# Patient Record
Sex: Male | Born: 1935 | Race: Black or African American | Hispanic: No | Marital: Married | Smoking: Former smoker
Health system: Southern US, Community
[De-identification: ages and names within clinical notes are randomized; demographics above are authoritative.]

## PROBLEM LIST (undated history)

## (undated) DIAGNOSIS — E785 Hyperlipidemia, unspecified: Secondary | ICD-10-CM

## (undated) DIAGNOSIS — Z86010 Personal history of colonic polyps: Secondary | ICD-10-CM

## (undated) DIAGNOSIS — E871 Hypo-osmolality and hyponatremia: Secondary | ICD-10-CM

## (undated) DIAGNOSIS — I739 Peripheral vascular disease, unspecified: Secondary | ICD-10-CM

## (undated) DIAGNOSIS — N400 Enlarged prostate without lower urinary tract symptoms: Secondary | ICD-10-CM

## (undated) DIAGNOSIS — K219 Gastro-esophageal reflux disease without esophagitis: Secondary | ICD-10-CM

## (undated) DIAGNOSIS — D509 Iron deficiency anemia, unspecified: Secondary | ICD-10-CM

## (undated) DIAGNOSIS — I10 Essential (primary) hypertension: Secondary | ICD-10-CM

## (undated) DIAGNOSIS — E119 Type 2 diabetes mellitus without complications: Secondary | ICD-10-CM

## (undated) DIAGNOSIS — G4700 Insomnia, unspecified: Secondary | ICD-10-CM

## (undated) DIAGNOSIS — N189 Chronic kidney disease, unspecified: Secondary | ICD-10-CM

## (undated) DIAGNOSIS — R071 Chest pain on breathing: Secondary | ICD-10-CM

## (undated) HISTORY — DX: Hyperlipidemia, unspecified: E78.5

## (undated) HISTORY — DX: Hypo-osmolality and hyponatremia: E87.1

## (undated) HISTORY — DX: Essential (primary) hypertension: I10

## (undated) HISTORY — DX: Chronic kidney disease, unspecified: N18.9

## (undated) HISTORY — DX: Iron deficiency anemia, unspecified: D50.9

## (undated) HISTORY — DX: Insomnia, unspecified: G47.00

## (undated) HISTORY — DX: Chest pain on breathing: R07.1

## (undated) HISTORY — DX: Type 2 diabetes mellitus without complications: E11.9

## (undated) HISTORY — DX: Personal history of colonic polyps: Z86.010

## (undated) HISTORY — DX: Gastro-esophageal reflux disease without esophagitis: K21.9

## (undated) HISTORY — DX: Benign prostatic hyperplasia without lower urinary tract symptoms: N40.0

## (undated) HISTORY — DX: Peripheral vascular disease, unspecified: I73.9

---

## 1998-02-19 ENCOUNTER — Other Ambulatory Visit: Admission: RE | Admit: 1998-02-19 | Discharge: 1998-02-19 | Payer: Self-pay | Admitting: Internal Medicine

## 2001-12-14 ENCOUNTER — Ambulatory Visit (HOSPITAL_COMMUNITY): Admission: RE | Admit: 2001-12-14 | Discharge: 2001-12-14 | Payer: Self-pay | Admitting: Internal Medicine

## 2001-12-14 ENCOUNTER — Encounter: Payer: Self-pay | Admitting: Internal Medicine

## 2002-01-27 ENCOUNTER — Encounter: Admission: RE | Admit: 2002-01-27 | Discharge: 2002-03-02 | Payer: Self-pay | Admitting: Orthopedic Surgery

## 2003-01-29 ENCOUNTER — Encounter: Payer: Self-pay | Admitting: Emergency Medicine

## 2003-01-29 ENCOUNTER — Inpatient Hospital Stay (HOSPITAL_COMMUNITY): Admission: EM | Admit: 2003-01-29 | Discharge: 2003-02-02 | Payer: Self-pay | Admitting: Emergency Medicine

## 2003-01-29 ENCOUNTER — Encounter: Payer: Self-pay | Admitting: Internal Medicine

## 2003-01-30 ENCOUNTER — Encounter: Payer: Self-pay | Admitting: Pulmonary Disease

## 2003-02-01 ENCOUNTER — Encounter: Payer: Self-pay | Admitting: Pulmonary Disease

## 2003-03-13 ENCOUNTER — Encounter: Payer: Self-pay | Admitting: Surgery

## 2003-03-13 ENCOUNTER — Encounter (INDEPENDENT_AMBULATORY_CARE_PROVIDER_SITE_OTHER): Payer: Self-pay | Admitting: Specialist

## 2003-03-13 HISTORY — PX: CHOLECYSTECTOMY: SHX55

## 2003-03-14 ENCOUNTER — Inpatient Hospital Stay (HOSPITAL_COMMUNITY): Admission: RE | Admit: 2003-03-14 | Discharge: 2003-03-16 | Payer: Self-pay | Admitting: Surgery

## 2004-06-21 ENCOUNTER — Ambulatory Visit (HOSPITAL_COMMUNITY): Admission: RE | Admit: 2004-06-21 | Discharge: 2004-06-21 | Payer: Self-pay | Admitting: Pulmonary Disease

## 2006-06-12 ENCOUNTER — Ambulatory Visit (HOSPITAL_COMMUNITY): Admission: RE | Admit: 2006-06-12 | Discharge: 2006-06-12 | Payer: Self-pay | Admitting: Pulmonary Disease

## 2007-02-14 ENCOUNTER — Emergency Department (HOSPITAL_COMMUNITY): Admission: EM | Admit: 2007-02-14 | Discharge: 2007-02-14 | Payer: Self-pay | Admitting: Emergency Medicine

## 2007-02-16 ENCOUNTER — Ambulatory Visit: Payer: Self-pay | Admitting: Internal Medicine

## 2007-02-24 ENCOUNTER — Encounter (INDEPENDENT_AMBULATORY_CARE_PROVIDER_SITE_OTHER): Payer: Self-pay | Admitting: Pulmonary Disease

## 2007-02-24 ENCOUNTER — Ambulatory Visit: Admission: RE | Admit: 2007-02-24 | Discharge: 2007-02-24 | Payer: Self-pay | Admitting: Pulmonary Disease

## 2007-04-07 ENCOUNTER — Ambulatory Visit (HOSPITAL_COMMUNITY): Admission: RE | Admit: 2007-04-07 | Discharge: 2007-04-07 | Payer: Self-pay | Admitting: Gastroenterology

## 2007-04-07 ENCOUNTER — Encounter (INDEPENDENT_AMBULATORY_CARE_PROVIDER_SITE_OTHER): Payer: Self-pay | Admitting: Gastroenterology

## 2007-05-12 ENCOUNTER — Ambulatory Visit: Payer: Self-pay | Admitting: Endocrinology

## 2007-05-12 LAB — CONVERTED CEMR LAB
Basophils Absolute: 0.1 10*3/uL (ref 0.0–0.1)
Basophils Relative: 0.8 % (ref 0.0–1.0)
Bilirubin, Direct: 0.1 mg/dL (ref 0.0–0.3)
Calcium: 9.5 mg/dL (ref 8.4–10.5)
Chloride: 110 meq/L (ref 96–112)
Creatinine, Ser: 1.5 mg/dL (ref 0.4–1.5)
Eosinophils Relative: 2.8 % (ref 0.0–5.0)
GFR calc non Af Amer: 48 mL/min
HCT: 32.9 % — ABNORMAL LOW (ref 39.0–52.0)
Hemoglobin: 11.2 g/dL — ABNORMAL LOW (ref 13.0–17.0)
Hgb A1c MFr Bld: 7.7 % — ABNORMAL HIGH (ref 4.6–6.0)
Lymphocytes Relative: 31.7 % (ref 12.0–46.0)
Monocytes Absolute: 0.5 10*3/uL (ref 0.2–0.7)
Monocytes Relative: 8.3 % (ref 3.0–11.0)
Neutro Abs: 3.6 10*3/uL (ref 1.4–7.7)
Neutrophils Relative %: 56.4 % (ref 43.0–77.0)
RBC: 3.89 M/uL — ABNORMAL LOW (ref 4.22–5.81)
RDW: 16.1 % — ABNORMAL HIGH (ref 11.5–14.6)
TSH: 2.41 microintl units/mL (ref 0.35–5.50)
Total Bilirubin: 0.9 mg/dL (ref 0.3–1.2)
Total Protein: 6.8 g/dL (ref 6.0–8.3)

## 2007-05-13 ENCOUNTER — Encounter: Payer: Self-pay | Admitting: Endocrinology

## 2007-05-13 DIAGNOSIS — E119 Type 2 diabetes mellitus without complications: Secondary | ICD-10-CM | POA: Insufficient documentation

## 2007-05-13 DIAGNOSIS — N400 Enlarged prostate without lower urinary tract symptoms: Secondary | ICD-10-CM

## 2007-05-13 DIAGNOSIS — K219 Gastro-esophageal reflux disease without esophagitis: Secondary | ICD-10-CM

## 2007-05-13 DIAGNOSIS — I10 Essential (primary) hypertension: Secondary | ICD-10-CM

## 2007-05-13 DIAGNOSIS — I739 Peripheral vascular disease, unspecified: Secondary | ICD-10-CM

## 2007-05-13 DIAGNOSIS — Z860100 Personal history of colon polyps, unspecified: Secondary | ICD-10-CM | POA: Insufficient documentation

## 2007-05-13 HISTORY — DX: Type 2 diabetes mellitus without complications: E11.9

## 2007-05-13 HISTORY — DX: Peripheral vascular disease, unspecified: I73.9

## 2007-05-13 HISTORY — DX: Essential (primary) hypertension: I10

## 2007-05-13 HISTORY — DX: Personal history of colonic polyps: Z86.010

## 2007-05-13 HISTORY — DX: Personal history of colon polyps, unspecified: Z86.0100

## 2007-05-13 HISTORY — DX: Gastro-esophageal reflux disease without esophagitis: K21.9

## 2007-05-13 HISTORY — DX: Benign prostatic hyperplasia without lower urinary tract symptoms: N40.0

## 2007-05-24 ENCOUNTER — Ambulatory Visit: Payer: Self-pay

## 2007-06-14 ENCOUNTER — Encounter: Payer: Self-pay | Admitting: *Deleted

## 2007-06-15 ENCOUNTER — Encounter: Payer: Self-pay | Admitting: Endocrinology

## 2007-06-15 ENCOUNTER — Ambulatory Visit: Payer: Self-pay | Admitting: Endocrinology

## 2007-06-17 ENCOUNTER — Telehealth: Payer: Self-pay | Admitting: Endocrinology

## 2007-06-18 ENCOUNTER — Telehealth: Payer: Self-pay | Admitting: Endocrinology

## 2007-06-23 ENCOUNTER — Telehealth (INDEPENDENT_AMBULATORY_CARE_PROVIDER_SITE_OTHER): Payer: Self-pay | Admitting: *Deleted

## 2007-06-23 ENCOUNTER — Encounter: Payer: Self-pay | Admitting: Emergency Medicine

## 2007-06-24 ENCOUNTER — Ambulatory Visit: Payer: Self-pay | Admitting: Endocrinology

## 2007-06-24 ENCOUNTER — Inpatient Hospital Stay (HOSPITAL_COMMUNITY): Admission: AD | Admit: 2007-06-24 | Discharge: 2007-06-30 | Payer: Self-pay | Admitting: Endocrinology

## 2007-06-29 ENCOUNTER — Telehealth (INDEPENDENT_AMBULATORY_CARE_PROVIDER_SITE_OTHER): Payer: Self-pay | Admitting: *Deleted

## 2007-07-22 ENCOUNTER — Ambulatory Visit: Payer: Self-pay | Admitting: Endocrinology

## 2007-07-22 DIAGNOSIS — G4700 Insomnia, unspecified: Secondary | ICD-10-CM

## 2007-07-22 DIAGNOSIS — E871 Hypo-osmolality and hyponatremia: Secondary | ICD-10-CM

## 2007-07-22 HISTORY — DX: Hypo-osmolality and hyponatremia: E87.1

## 2007-07-22 HISTORY — DX: Insomnia, unspecified: G47.00

## 2007-07-23 LAB — CONVERTED CEMR LAB
BUN: 15 mg/dL (ref 6–23)
CO2: 24 meq/L (ref 19–32)
Calcium: 9.3 mg/dL (ref 8.4–10.5)
Chloride: 107 meq/L (ref 96–112)
Eosinophils Absolute: 0.3 10*3/uL (ref 0.0–0.6)
Folate: 8.8 ng/mL
GFR calc Af Amer: 75 mL/min
GFR calc non Af Amer: 62 mL/min
Glucose, Bld: 143 mg/dL — ABNORMAL HIGH (ref 70–99)
Hemoglobin: 11.7 g/dL — ABNORMAL LOW (ref 13.0–17.0)
Iron: 53 ug/dL (ref 42–165)
MCV: 86.3 fL (ref 78.0–100.0)
Platelets: 487 10*3/uL — ABNORMAL HIGH (ref 150–400)
RDW: 15.4 % — ABNORMAL HIGH (ref 11.5–14.6)
Sodium: 140 meq/L (ref 135–145)
WBC: 6.6 10*3/uL (ref 4.5–10.5)

## 2007-07-27 ENCOUNTER — Telehealth (INDEPENDENT_AMBULATORY_CARE_PROVIDER_SITE_OTHER): Payer: Self-pay | Admitting: *Deleted

## 2007-07-28 ENCOUNTER — Telehealth (INDEPENDENT_AMBULATORY_CARE_PROVIDER_SITE_OTHER): Payer: Self-pay | Admitting: *Deleted

## 2007-07-28 ENCOUNTER — Encounter: Payer: Self-pay | Admitting: Endocrinology

## 2007-08-02 ENCOUNTER — Telehealth (INDEPENDENT_AMBULATORY_CARE_PROVIDER_SITE_OTHER): Payer: Self-pay | Admitting: *Deleted

## 2007-08-02 ENCOUNTER — Ambulatory Visit: Payer: Self-pay | Admitting: Endocrinology

## 2007-08-25 ENCOUNTER — Telehealth (INDEPENDENT_AMBULATORY_CARE_PROVIDER_SITE_OTHER): Payer: Self-pay | Admitting: *Deleted

## 2007-08-30 ENCOUNTER — Ambulatory Visit: Payer: Self-pay | Admitting: Endocrinology

## 2007-09-06 ENCOUNTER — Telehealth (INDEPENDENT_AMBULATORY_CARE_PROVIDER_SITE_OTHER): Payer: Self-pay | Admitting: *Deleted

## 2007-09-07 ENCOUNTER — Encounter (INDEPENDENT_AMBULATORY_CARE_PROVIDER_SITE_OTHER): Payer: Self-pay | Admitting: *Deleted

## 2007-09-22 ENCOUNTER — Ambulatory Visit: Payer: Self-pay | Admitting: Pulmonary Disease

## 2007-10-04 ENCOUNTER — Telehealth: Payer: Self-pay | Admitting: Pulmonary Disease

## 2007-10-05 ENCOUNTER — Ambulatory Visit: Payer: Self-pay | Admitting: Endocrinology

## 2007-10-13 ENCOUNTER — Ambulatory Visit: Payer: Self-pay | Admitting: Pulmonary Disease

## 2007-10-13 ENCOUNTER — Telehealth (INDEPENDENT_AMBULATORY_CARE_PROVIDER_SITE_OTHER): Payer: Self-pay | Admitting: *Deleted

## 2007-10-15 ENCOUNTER — Telehealth (INDEPENDENT_AMBULATORY_CARE_PROVIDER_SITE_OTHER): Payer: Self-pay | Admitting: *Deleted

## 2007-10-15 ENCOUNTER — Telehealth: Payer: Self-pay | Admitting: Endocrinology

## 2008-01-07 ENCOUNTER — Telehealth (INDEPENDENT_AMBULATORY_CARE_PROVIDER_SITE_OTHER): Payer: Self-pay | Admitting: *Deleted

## 2008-01-10 ENCOUNTER — Ambulatory Visit: Payer: Self-pay | Admitting: Endocrinology

## 2008-01-10 DIAGNOSIS — R071 Chest pain on breathing: Secondary | ICD-10-CM

## 2008-01-10 DIAGNOSIS — M545 Low back pain: Secondary | ICD-10-CM | POA: Insufficient documentation

## 2008-01-10 HISTORY — DX: Chest pain on breathing: R07.1

## 2008-01-10 LAB — CONVERTED CEMR LAB
Hemoglobin, Urine: NEGATIVE
Hgb A1c MFr Bld: 7.5 % — ABNORMAL HIGH (ref 4.6–6.0)
Ketones, ur: NEGATIVE mg/dL
Leukocytes, UA: NEGATIVE
Mucus, UA: NEGATIVE

## 2008-01-25 ENCOUNTER — Telehealth: Payer: Self-pay | Admitting: Endocrinology

## 2008-01-31 ENCOUNTER — Telehealth (INDEPENDENT_AMBULATORY_CARE_PROVIDER_SITE_OTHER): Payer: Self-pay | Admitting: *Deleted

## 2008-02-01 ENCOUNTER — Encounter: Payer: Self-pay | Admitting: Endocrinology

## 2008-02-04 ENCOUNTER — Telehealth: Payer: Self-pay | Admitting: Endocrinology

## 2008-02-10 ENCOUNTER — Telehealth (INDEPENDENT_AMBULATORY_CARE_PROVIDER_SITE_OTHER): Payer: Self-pay | Admitting: *Deleted

## 2008-02-15 ENCOUNTER — Encounter: Payer: Self-pay | Admitting: Endocrinology

## 2008-02-16 ENCOUNTER — Telehealth: Payer: Self-pay | Admitting: Endocrinology

## 2008-02-22 ENCOUNTER — Telehealth (INDEPENDENT_AMBULATORY_CARE_PROVIDER_SITE_OTHER): Payer: Self-pay | Admitting: *Deleted

## 2008-02-24 ENCOUNTER — Ambulatory Visit: Payer: Self-pay | Admitting: Endocrinology

## 2008-02-24 DIAGNOSIS — M25569 Pain in unspecified knee: Secondary | ICD-10-CM | POA: Insufficient documentation

## 2008-02-24 DIAGNOSIS — L723 Sebaceous cyst: Secondary | ICD-10-CM

## 2008-03-10 ENCOUNTER — Telehealth: Payer: Self-pay | Admitting: Endocrinology

## 2008-03-22 ENCOUNTER — Telehealth (INDEPENDENT_AMBULATORY_CARE_PROVIDER_SITE_OTHER): Payer: Self-pay | Admitting: *Deleted

## 2008-03-23 ENCOUNTER — Ambulatory Visit: Payer: Self-pay | Admitting: Endocrinology

## 2008-03-23 DIAGNOSIS — R51 Headache: Secondary | ICD-10-CM | POA: Insufficient documentation

## 2008-03-23 DIAGNOSIS — R519 Headache, unspecified: Secondary | ICD-10-CM | POA: Insufficient documentation

## 2008-03-23 LAB — CONVERTED CEMR LAB
Basophils Absolute: 0 10*3/uL (ref 0.0–0.1)
Eosinophils Absolute: 0.2 10*3/uL (ref 0.0–0.7)
Eosinophils Relative: 2.5 % (ref 0.0–5.0)
Hemoglobin: 12 g/dL — ABNORMAL LOW (ref 13.0–17.0)
Hgb A1c MFr Bld: 7.4 % — ABNORMAL HIGH (ref 4.6–6.0)
MCHC: 33.9 g/dL (ref 30.0–36.0)
MCV: 85.9 fL (ref 78.0–100.0)
Monocytes Absolute: 0.4 10*3/uL (ref 0.1–1.0)
Platelets: 406 10*3/uL — ABNORMAL HIGH (ref 150–400)
RBC: 4.12 M/uL — ABNORMAL LOW (ref 4.22–5.81)
Sed Rate: 46 mm/hr — ABNORMAL HIGH (ref 0–16)

## 2008-03-24 ENCOUNTER — Telehealth (INDEPENDENT_AMBULATORY_CARE_PROVIDER_SITE_OTHER): Payer: Self-pay | Admitting: *Deleted

## 2008-04-24 ENCOUNTER — Telehealth (INDEPENDENT_AMBULATORY_CARE_PROVIDER_SITE_OTHER): Payer: Self-pay | Admitting: *Deleted

## 2008-04-25 ENCOUNTER — Encounter: Payer: Self-pay | Admitting: Internal Medicine

## 2008-04-27 ENCOUNTER — Telehealth (INDEPENDENT_AMBULATORY_CARE_PROVIDER_SITE_OTHER): Payer: Self-pay | Admitting: *Deleted

## 2008-07-28 ENCOUNTER — Telehealth: Payer: Self-pay | Admitting: Endocrinology

## 2008-07-31 ENCOUNTER — Encounter: Payer: Self-pay | Admitting: Endocrinology

## 2008-08-01 ENCOUNTER — Inpatient Hospital Stay (HOSPITAL_COMMUNITY): Admission: RE | Admit: 2008-08-01 | Discharge: 2008-08-04 | Payer: Self-pay | Admitting: Urology

## 2008-08-01 ENCOUNTER — Encounter (INDEPENDENT_AMBULATORY_CARE_PROVIDER_SITE_OTHER): Payer: Self-pay | Admitting: Urology

## 2008-08-01 HISTORY — PX: SUPRAPUBIC PROSTATECTOMY: SUR1056

## 2008-08-10 ENCOUNTER — Telehealth (INDEPENDENT_AMBULATORY_CARE_PROVIDER_SITE_OTHER): Payer: Self-pay | Admitting: *Deleted

## 2008-08-16 ENCOUNTER — Telehealth: Payer: Self-pay | Admitting: Endocrinology

## 2008-08-16 ENCOUNTER — Encounter (INDEPENDENT_AMBULATORY_CARE_PROVIDER_SITE_OTHER): Payer: Self-pay | Admitting: *Deleted

## 2008-08-30 ENCOUNTER — Telehealth (INDEPENDENT_AMBULATORY_CARE_PROVIDER_SITE_OTHER): Payer: Self-pay | Admitting: *Deleted

## 2008-09-22 ENCOUNTER — Ambulatory Visit: Payer: Self-pay | Admitting: Endocrinology

## 2008-09-22 ENCOUNTER — Telehealth (INDEPENDENT_AMBULATORY_CARE_PROVIDER_SITE_OTHER): Payer: Self-pay | Admitting: *Deleted

## 2008-09-22 DIAGNOSIS — M79609 Pain in unspecified limb: Secondary | ICD-10-CM

## 2008-09-22 DIAGNOSIS — R209 Unspecified disturbances of skin sensation: Secondary | ICD-10-CM

## 2008-09-24 LAB — CONVERTED CEMR LAB
BUN: 20 mg/dL (ref 6–23)
CO2: 25 meq/L (ref 19–32)
Chloride: 105 meq/L (ref 96–112)
Creatinine, Ser: 1.4 mg/dL (ref 0.4–1.5)
Direct LDL: 194.3 mg/dL
Folate: >20 ng/mL
Hgb A1c MFr Bld: 6.2 % — ABNORMAL HIGH (ref 4.6–6.0)
Sodium: 138 meq/L (ref 135–145)
Triglycerides: 142 mg/dL (ref 0–149)

## 2008-09-25 ENCOUNTER — Telehealth (INDEPENDENT_AMBULATORY_CARE_PROVIDER_SITE_OTHER): Payer: Self-pay | Admitting: *Deleted

## 2008-09-28 ENCOUNTER — Ambulatory Visit: Payer: Self-pay | Admitting: Endocrinology

## 2008-10-07 ENCOUNTER — Telehealth: Payer: Self-pay | Admitting: Family Medicine

## 2008-10-12 ENCOUNTER — Encounter: Payer: Self-pay | Admitting: Endocrinology

## 2008-10-25 ENCOUNTER — Ambulatory Visit: Payer: Self-pay | Admitting: Endocrinology

## 2008-10-25 DIAGNOSIS — D509 Iron deficiency anemia, unspecified: Secondary | ICD-10-CM

## 2008-10-25 DIAGNOSIS — R42 Dizziness and giddiness: Secondary | ICD-10-CM | POA: Insufficient documentation

## 2008-10-25 HISTORY — DX: Iron deficiency anemia, unspecified: D50.9

## 2008-10-25 LAB — CONVERTED CEMR LAB
Basophils Absolute: 0 10*3/uL (ref 0.0–0.1)
Basophils Relative: 0.5 % (ref 0.0–3.0)
Eosinophils Relative: 1.9 % (ref 0.0–5.0)
HCT: 35.6 % — ABNORMAL LOW (ref 39.0–52.0)
Iron: 42 ug/dL (ref 42–165)
Lymphocytes Relative: 34.5 % (ref 12.0–46.0)
MCV: 80.6 fL (ref 78.0–100.0)
Monocytes Absolute: 0.6 10*3/uL (ref 0.1–1.0)
Monocytes Relative: 9.2 % (ref 3.0–12.0)
Platelets: 422 10*3/uL — ABNORMAL HIGH (ref 150–400)
RBC: 4.42 M/uL (ref 4.22–5.81)
RDW: 15.1 % — ABNORMAL HIGH (ref 11.5–14.6)
Transferrin: 256.3 mg/dL (ref >=212.0)

## 2008-10-27 ENCOUNTER — Encounter: Payer: Self-pay | Admitting: Endocrinology

## 2008-10-27 ENCOUNTER — Ambulatory Visit: Payer: Self-pay

## 2008-10-31 ENCOUNTER — Telehealth: Payer: Self-pay | Admitting: Endocrinology

## 2008-11-01 ENCOUNTER — Telehealth (INDEPENDENT_AMBULATORY_CARE_PROVIDER_SITE_OTHER): Payer: Self-pay | Admitting: *Deleted

## 2008-11-01 ENCOUNTER — Telehealth: Payer: Self-pay | Admitting: Endocrinology

## 2008-11-02 ENCOUNTER — Telehealth (INDEPENDENT_AMBULATORY_CARE_PROVIDER_SITE_OTHER): Payer: Self-pay | Admitting: *Deleted

## 2008-11-06 ENCOUNTER — Telehealth (INDEPENDENT_AMBULATORY_CARE_PROVIDER_SITE_OTHER): Payer: Self-pay | Admitting: *Deleted

## 2008-11-09 ENCOUNTER — Ambulatory Visit: Payer: Self-pay | Admitting: Endocrinology

## 2008-11-23 ENCOUNTER — Telehealth (INDEPENDENT_AMBULATORY_CARE_PROVIDER_SITE_OTHER): Payer: Self-pay | Admitting: *Deleted

## 2008-11-24 ENCOUNTER — Encounter: Payer: Self-pay | Admitting: Endocrinology

## 2009-01-02 ENCOUNTER — Ambulatory Visit: Payer: Self-pay | Admitting: Endocrinology

## 2009-02-20 ENCOUNTER — Ambulatory Visit: Payer: Self-pay | Admitting: Endocrinology

## 2009-02-22 ENCOUNTER — Encounter: Payer: Self-pay | Admitting: Endocrinology

## 2009-02-26 ENCOUNTER — Telehealth: Payer: Self-pay | Admitting: Endocrinology

## 2009-03-08 ENCOUNTER — Telehealth (INDEPENDENT_AMBULATORY_CARE_PROVIDER_SITE_OTHER): Payer: Self-pay | Admitting: *Deleted

## 2009-03-14 ENCOUNTER — Telehealth: Payer: Self-pay | Admitting: Endocrinology

## 2009-03-15 ENCOUNTER — Encounter: Payer: Self-pay | Admitting: Endocrinology

## 2009-03-19 ENCOUNTER — Telehealth (INDEPENDENT_AMBULATORY_CARE_PROVIDER_SITE_OTHER): Payer: Self-pay | Admitting: *Deleted

## 2009-04-03 IMAGING — CR DG CHEST 1V PORT
1 series · 1 of 1 positions shown · non-contrast
Comparison: None.

CLINICAL DATA: Chest pain. 
 PORTABLE CHEST ? 1 VIEW:

[view not recorded]
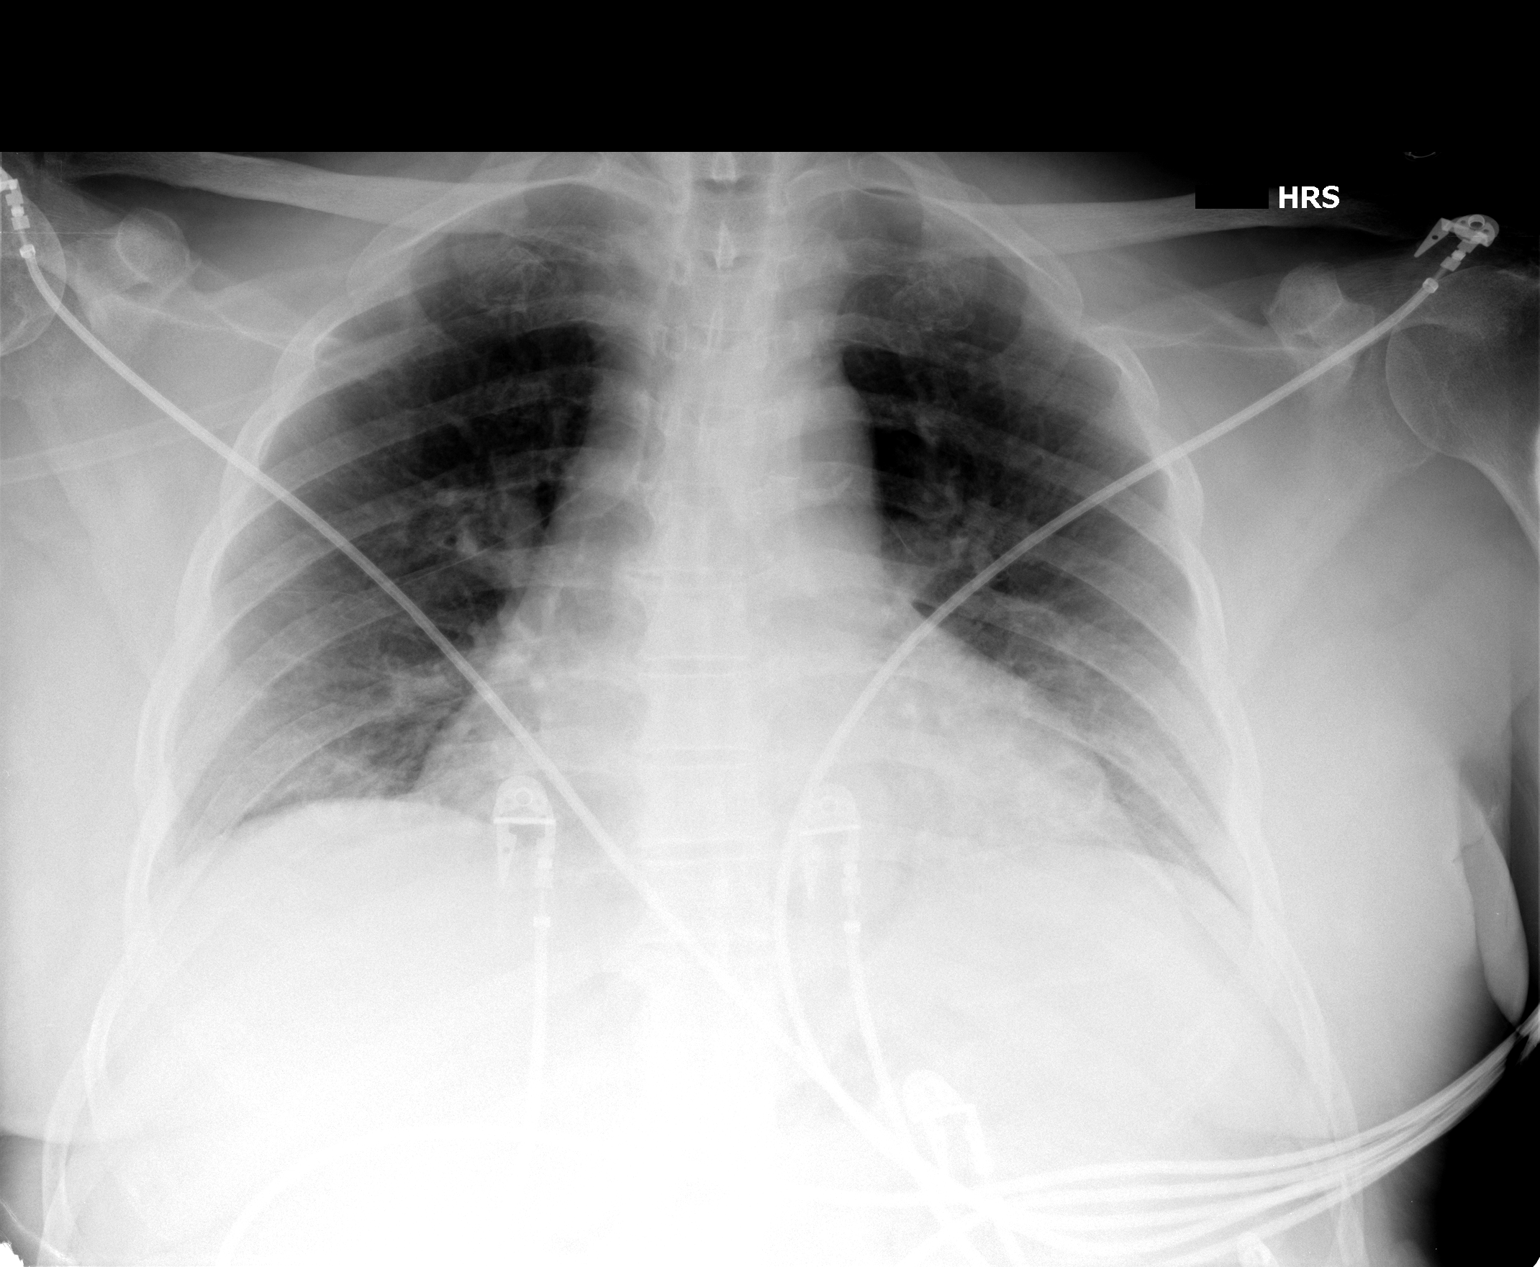

[1 of 1 positions shown; findings below may reference images not displayed]

FINDINGS: Lung volumes are low with mild bibasilar atelectasis.  There is cardiomegaly and pulmonary vascular congestion.  No pleural effusion.  Small hiatal hernia is noted.
IMPRESSION: 1.  Cardiomegaly and vascular congestion. 
 2.  Low lung volumes with mild bibasilar atelectasis.

## 2009-04-13 ENCOUNTER — Telehealth: Payer: Self-pay | Admitting: Endocrinology

## 2009-04-16 ENCOUNTER — Ambulatory Visit: Payer: Self-pay | Admitting: Internal Medicine

## 2009-05-29 ENCOUNTER — Ambulatory Visit: Payer: Self-pay | Admitting: Endocrinology

## 2009-05-29 DIAGNOSIS — N189 Chronic kidney disease, unspecified: Secondary | ICD-10-CM

## 2009-05-29 HISTORY — DX: Chronic kidney disease, unspecified: N18.9

## 2009-05-31 LAB — CONVERTED CEMR LAB
BUN: 16 mg/dL (ref 6–23)
Eosinophils Absolute: 0.1 10*3/uL (ref 0.0–0.7)
GFR calc non Af Amer: 53.22 mL/min (ref >60)
HCT: 36.2 % — ABNORMAL LOW (ref 39.0–52.0)
Hemoglobin: 12 g/dL — ABNORMAL LOW (ref 13.0–17.0)
Lymphocytes Relative: 30.4 % (ref 12.0–46.0)
MCHC: 33.3 g/dL (ref 30.0–36.0)
MCV: 83.1 fL (ref 78.0–100.0)
Monocytes Absolute: 0.4 10*3/uL (ref 0.1–1.0)
Monocytes Relative: 4.7 % (ref 3.0–12.0)
Platelets: 408 10*3/uL — ABNORMAL HIGH (ref 150.0–400.0)
Potassium: 3.6 meq/L (ref 3.5–5.1)
RBC: 4.36 M/uL (ref 4.22–5.81)
RDW: 15.7 % — ABNORMAL HIGH (ref 11.5–14.6)
Saturation Ratios: 14.4 % — ABNORMAL LOW (ref 20.0–50.0)
Sed Rate: 40 mm/hr — ABNORMAL HIGH (ref 0–22)
Sodium: 142 meq/L (ref 135–145)
WBC: 7.5 10*3/uL (ref 4.5–10.5)

## 2009-06-12 ENCOUNTER — Ambulatory Visit: Payer: Self-pay | Admitting: Endocrinology

## 2009-06-12 DIAGNOSIS — R9431 Abnormal electrocardiogram [ECG] [EKG]: Secondary | ICD-10-CM | POA: Insufficient documentation

## 2009-06-26 ENCOUNTER — Ambulatory Visit: Payer: Self-pay | Admitting: Endocrinology

## 2009-07-03 ENCOUNTER — Ambulatory Visit: Payer: Self-pay | Admitting: Cardiovascular Disease

## 2009-07-05 ENCOUNTER — Encounter: Payer: Self-pay | Admitting: Endocrinology

## 2009-07-06 ENCOUNTER — Telehealth: Payer: Self-pay | Admitting: Endocrinology

## 2009-07-17 ENCOUNTER — Encounter: Payer: Self-pay | Admitting: Endocrinology

## 2009-07-30 ENCOUNTER — Telehealth (INDEPENDENT_AMBULATORY_CARE_PROVIDER_SITE_OTHER): Payer: Self-pay | Admitting: *Deleted

## 2009-08-06 ENCOUNTER — Telehealth: Payer: Self-pay | Admitting: Endocrinology

## 2009-09-20 ENCOUNTER — Telehealth (INDEPENDENT_AMBULATORY_CARE_PROVIDER_SITE_OTHER): Payer: Self-pay | Admitting: *Deleted

## 2009-09-23 ENCOUNTER — Encounter: Payer: Self-pay | Admitting: Endocrinology

## 2009-10-01 ENCOUNTER — Telehealth: Payer: Self-pay | Admitting: Endocrinology

## 2009-10-12 ENCOUNTER — Telehealth: Payer: Self-pay | Admitting: Endocrinology

## 2009-11-15 ENCOUNTER — Ambulatory Visit: Payer: Self-pay | Admitting: Endocrinology

## 2009-11-15 DIAGNOSIS — L0231 Cutaneous abscess of buttock: Secondary | ICD-10-CM

## 2009-11-15 DIAGNOSIS — L03317 Cellulitis of buttock: Secondary | ICD-10-CM | POA: Insufficient documentation

## 2009-11-15 LAB — CONVERTED CEMR LAB
BUN: 20 mg/dL (ref 6–23)
Basophils Relative: 0.6 % (ref 0.0–3.0)
Blood in Urine, dipstick: NEGATIVE
Chloride: 104 meq/L (ref 96–112)
Creatinine, Ser: 1.7 mg/dL — ABNORMAL HIGH (ref 0.4–1.5)
GFR calc non Af Amer: 49.57 mL/min (ref >60)
Glucose, Urine, Semiquant: NEGATIVE
Hemoglobin: 11.8 g/dL — ABNORMAL LOW (ref 13.0–17.0)
Hgb A1c MFr Bld: 9.4 % — ABNORMAL HIGH (ref 4.6–6.5)
Ketones, urine, test strip: NEGATIVE
Lymphocytes Relative: 35.8 % (ref 12.0–46.0)
MCHC: 32.8 g/dL (ref 30.0–36.0)
MCV: 83.1 fL (ref 78.0–100.0)
Monocytes Relative: 6.6 % (ref 3.0–12.0)
Neutro Abs: 3.7 10*3/uL (ref 1.4–7.7)
Neutrophils Relative %: 54 % (ref 43.0–77.0)
Nitrite: NEGATIVE
Platelets: 334 10*3/uL (ref 150.0–400.0)
RBC: 4.34 M/uL (ref 4.22–5.81)
RDW: 14.7 % — ABNORMAL HIGH (ref 11.5–14.6)
Saturation Ratios: 8.7 % — ABNORMAL LOW (ref 20.0–50.0)
Specific Gravity, Urine: 1.01
Urobilinogen, UA: 0.2
WBC Urine, dipstick: NEGATIVE
pH: 5

## 2010-03-20 ENCOUNTER — Encounter: Payer: Self-pay | Admitting: Endocrinology

## 2010-03-20 ENCOUNTER — Telehealth: Payer: Self-pay | Admitting: Endocrinology

## 2010-05-27 ENCOUNTER — Telehealth: Payer: Self-pay | Admitting: Endocrinology

## 2010-05-30 ENCOUNTER — Ambulatory Visit: Payer: Self-pay | Admitting: Endocrinology

## 2010-05-30 DIAGNOSIS — E785 Hyperlipidemia, unspecified: Secondary | ICD-10-CM

## 2010-05-30 HISTORY — DX: Hyperlipidemia, unspecified: E78.5

## 2010-05-30 LAB — CONVERTED CEMR LAB
ALT: 13 units/L (ref 0–53)
Alkaline Phosphatase: 71 units/L (ref 39–117)
BUN: 29 mg/dL — ABNORMAL HIGH (ref 6–23)
Basophils Absolute: 0 10*3/uL (ref 0.0–0.1)
Basophils Relative: 0.5 % (ref 0.0–3.0)
Bilirubin, Direct: 0.1 mg/dL (ref 0.0–0.3)
CO2: 27 meq/L (ref 19–32)
Calcium, Total (PTH): 9.3 mg/dL (ref 8.4–10.5)
Calcium: 9.3 mg/dL (ref 8.4–10.5)
Cholesterol: 322 mg/dL — ABNORMAL HIGH (ref 0–200)
GFR calc non Af Amer: 45.18 mL/min (ref >60)
HCT: 36.6 % — ABNORMAL LOW (ref 39.0–52.0)
Hemoglobin: 12.6 g/dL — ABNORMAL LOW (ref 13.0–17.0)
Ketones, ur: NEGATIVE mg/dL
Leukocytes, UA: NEGATIVE
MCHC: 34.5 g/dL (ref 30.0–36.0)
MCV: 84.1 fL (ref 78.0–100.0)
Monocytes Absolute: 0.6 10*3/uL (ref 0.1–1.0)
Neutro Abs: 3 10*3/uL (ref 1.4–7.7)
Nitrite: NEGATIVE
PSA: 1.94 ng/mL (ref 0.10–4.00)
PTH: 44.1 pg/mL (ref 14.0–72.0)
Platelets: 361 10*3/uL (ref 150.0–400.0)
Potassium: 3.9 meq/L (ref 3.5–5.1)
RBC: 4.35 M/uL (ref 4.22–5.81)
RDW: 15.5 % — ABNORMAL HIGH (ref 11.5–14.6)
Sodium: 138 meq/L (ref 135–145)
Total Bilirubin: 0.7 mg/dL (ref 0.3–1.2)
Total Protein, Urine: NEGATIVE mg/dL
Total Protein: 7 g/dL (ref 6.0–8.3)
Transferrin: 233.9 mg/dL (ref 212.0–360.0)
Triglycerides: 154 mg/dL — ABNORMAL HIGH (ref 0.0–149.0)
pH: 5 (ref 5.0–8.0)

## 2010-06-18 ENCOUNTER — Telehealth: Payer: Self-pay | Admitting: Endocrinology

## 2010-07-05 ENCOUNTER — Ambulatory Visit: Payer: Self-pay | Admitting: Endocrinology

## 2010-07-08 LAB — CONVERTED CEMR LAB: OCCULT 1: NEGATIVE

## 2010-07-10 ENCOUNTER — Telehealth: Payer: Self-pay | Admitting: Endocrinology

## 2010-07-12 ENCOUNTER — Ambulatory Visit: Payer: Self-pay | Admitting: Endocrinology

## 2010-07-12 LAB — CONVERTED CEMR LAB
ALT: 17 units/L (ref 0–53)
Alkaline Phosphatase: 75 units/L (ref 39–117)
Cholesterol: 158 mg/dL (ref 0–200)
HCT: 35.5 % — ABNORMAL LOW (ref 39.0–52.0)
Hemoglobin: 11.8 g/dL — ABNORMAL LOW (ref 13.0–17.0)
Lymphocytes Relative: 24.1 % (ref 12.0–46.0)
MCHC: 33.2 g/dL (ref 30.0–36.0)
MCV: 83.5 fL (ref 78.0–100.0)
Monocytes Absolute: 0.4 10*3/uL (ref 0.1–1.0)
Monocytes Relative: 6.3 % (ref 3.0–12.0)
Neutrophils Relative %: 67.1 % (ref 43.0–77.0)
Platelets: 320 10*3/uL (ref 150.0–400.0)
RBC: 4.26 M/uL (ref 4.22–5.81)
RDW: 14.6 % (ref 11.5–14.6)
Saturation Ratios: 13.3 % — ABNORMAL LOW (ref 20.0–50.0)
Total Bilirubin: 0.7 mg/dL (ref 0.3–1.2)
Total CHOL/HDL Ratio: 3
Transferrin: 247.7 mg/dL (ref 212.0–360.0)
Triglycerides: 154 mg/dL — ABNORMAL HIGH (ref 0.0–149.0)
VLDL: 30.8 mg/dL (ref 0.0–40.0)

## 2010-08-05 ENCOUNTER — Telehealth: Payer: Self-pay | Admitting: Endocrinology

## 2010-08-07 ENCOUNTER — Encounter: Payer: Self-pay | Admitting: Endocrinology

## 2010-09-14 ENCOUNTER — Encounter: Payer: Self-pay | Admitting: Endocrinology

## 2010-09-17 ENCOUNTER — Telehealth: Payer: Self-pay | Admitting: Endocrinology

## 2010-09-21 ENCOUNTER — Encounter: Payer: Self-pay | Admitting: Pulmonary Disease

## 2010-10-01 NOTE — Assessment & Plan Note (Signed)
Summary: BOIL/BUTTOCK--STC   Vital Signs:  Patient profile:   77 year old male Height:      67 inches (170.18 cm) Weight:      209.25 pounds (95.11 kg) O2 Sat:      98 % on Room air Temp:     97.5 degrees F (36.39 degrees C) oral Pulse rate:   88 / minute BP sitting:   148 / 78  (left arm) Cuff size:   large  Vitals Entered By: Josph Macho RMA (November 15, 2009 9:51 AM)  O2 Flow:  Room air CC: Boil on right buttocks/ pt would like samples of Januvia/ Pt [facility] a [facility], a RX sent to [facility] for Effient-90 day supply, and30 day [facility] and [facility] supply to [facility]- Pt states he would like a year of refills for all three meds to [facility]/ [facility] Is Patient Diabetic? Yes   Primary Provider:  Minus Breeding MD  CC:  Boil on right buttocks/ pt would like samples of Januvia/ Pt needs a [facility], a RX sent to [facility] for Effient-90 day supply, and and30 day supply [facility] and a [facility] supply to [facility]- Pt states he would like a year of refills for all three meds to [facility]/ [facility].  History of Present Illness: 1 day of slightly painful nodule at the right buttock.  no associated bleeding. pt states in am, urine is pink-tinged in am.   no cbg record, but states cbg's are 170-215. pt stopped fe tabs  Current Medications (verified): 1)  Januvia 50 Mg  Tabs (Sitagliptin Phosphate) .Marland Kitchen.. 1 By Mouth Once Daily 2)  Alphagan P 0.1 %  Soln (Brimonidine Tartrate) .... Use 1 Drop in Each (R) Eye Qd 3)  Cosopt 2-0.5 %  Soln (Dorzolamide-Timolol) .... Use 1 Drop in Each Eye Two Times A Day Qd 4)  Travatan 0.004 %  Soln (Travoprost) .... Use 1 Drop in Each Eye Qhs 5)  Furosemide 40 Mg  Tabs (Furosemide) .... Take 1 By Mouth Once Daily 6)  Pletal 100 Mg  Tabs (Cilostazol) .... Take 1 Tablet By Mouth Once A Day 7)  Multivitamins   Tabs (Multiple  Vitamin) .... Take 1 Tablet By Mouth Once A Day 8)  Omeprazole 20 Mg Cpdr (Omeprazole) .... 2 By Mouth Once Daily 9)  Alprazolam 0.5 Mg Tbdp (Alprazolam) .... At Bedtime As Needed Sleep 10)  Benicar 40 Mg Tabs (Olmesartan Medoxomil) .Marland Kitchen.. 1 Tab By Mouth Once Daily 11)  Tramadol Hcl 50 Mg Tabs (Tramadol Hcl) .Marland Kitchen.. 1 Q4h Prn Pain 12)  Ferrous Sulfate 325 (65 Fe) Mg  Tabs (Ferrous Sulfate) .Marland Kitchen.. 1 Tab By Mouth Once Daily 13)  Effient 10 Mg Tabs (Prasugrel Hcl) .Marland Kitchen.. 1 Daily  Allergies (verified): 1)  ! Actos (Pioglitazone Hcl)  Past History:  Past Medical History: Last updated: 07/02/2009 PERIPHERAL VASCULAR DISEASE (ICD-443.9) CHEST WALL PAIN, ACUTE (ICD-786.52) ABNORMAL ELECTROCARDIOGRAM (ICD-794.31) RENAL INSUFFICIENCY, CHRONIC (ICD-585.9) HYPERTENSION (ICD-401.9) DIZZINESS (ICD-780.4) ANEMIA, IRON DEFICIENCY (ICD-280.9) HEADACHE (ICD-784.0) NUMBNESS (ICD-782.0) KNEE PAIN, RIGHT (ICD-719.46) SEBACEOUS CYST, INFECTED (ICD-706.2) BACK PAIN, LUMBAR (ICD-724.2) INSOMNIA (ICD-780.52) HYPOSMOLALITY AND/OR HYPONATREMIA (ICD-276.1) COLONIC POLYPS, HX OF (ICD-V12.72) BENIGN PROSTATIC HYPERTROPHY (ICD-600.00) GERD (ICD-530.81) DIABETES MELLITUS, TYPE II (ICD-250.00) THUMB PAIN, RIGHT (ICD-729.5) THIGH PAIN (ICD-729.5)    Review of Systems  The patient denies fever.  denies dysuria.  Physical Exam  General:  obese.  no distress  Skin:  left buttock: there is a firm/tender mass approx 3 cm diameter.  no drainage Additional Exam:  procedure: incision/drainage of abcess left buttock consent signed/pt agrees local 2% xylocaine with epi prep betadine # 15 blade used to i and d the mass.  slight bloody material is expressed no complications neosporin and a large bandaid are applied  labs: Hemoglobin A1C       [H]  9.4 %    Hemoglobin           [L]  11.8 g/dL                   56.2-13.0   Hematocrit           [L]  36.0 %                      39.0-52.0   Platelet Count             334.0 K/uL                  150.0-400.0      Iron Saturation      [L]  8.7 %                       20.0-50.0    Impression & Recommendations:  Problem # 1:  CELLULITIS AND ABSCESS OF BUTTOCK (ICD-682.5) s/p i and d  Problem # 2:  DIABETES MELLITUS, TYPE II (ICD-250.00) needs increased rx  Problem # 3:  ANEMIA, IRON DEFICIENCY (ICD-280.9) needs increased rx  Medications Added to Medication List This Visit: 1)  Doxycycline Hyclate 100 Mg Caps (Doxycycline hyclate) .Marland Kitchen.. 1 two times a day 2)  Bromocriptine Mesylate 2.5 Mg Tabs (Bromocriptine mesylate) .... 1/2 tab at bedtime  Other Orders: TLB-A1C / Hgb A1C (Glycohemoglobin) (83036-A1C) TLB-BMP (Basic Metabolic Panel-BMET) (80048-METABOL) TLB-CBC Platelet - w/Differential (85025-CBCD) TLB-IBC Pnl (Iron/FE;Transferrin) (83550-IBC) Est. Patient Level IV (86578) I&D Abscess, Simple / Single (10060)  Patient Instructions: 1)  doxycycline 100 mg two times a day 2)  daily, remove the bandaid, rinse the area gently with water, replace antibiotic ointment, then replace large bandaid. 3)  return if pain or swelling is worse. 4)  we'll recheck the urine in the future 5)  tests are being ordered for you today.  a few days after the test(s), please call [phone] to hear your test results. 6)  pending the test results, please continue the same medications for now 7)  (update: i left message on phone-tree:  take bromocriptine 1/2 of 2.5 mg at bedtime.  take iron, 1 pill two times a day. 8)  return 2 weeks with cbg record.  plan will be to check hemoccults then). Prescriptions: BROMOCRIPTINE MESYLATE 2.5 MG TABS (BROMOCRIPTINE MESYLATE) 1/2 tab at bedtime  #45 x 3   Entered and Authorized by:   Minus Breeding MD   Signed by:   Minus Breeding MD on 11/15/2009   Method used:   Electronically to        [facility]* (mail-order)             ,          Ph: [phone]       Fax: [phone]   RxID:   [id number] ALPRAZOLAM  0.5 MG TBDP (ALPRAZOLAM) at bedtime as needed sleep  #90 x 1   Entered and Authorized  by:   Minus Breeding MD   Signed by:   Minus Breeding MD on 11/15/2009   Method used:   Print then Give to Patient   RxID:   [id number] EFFIENT 10 MG TABS (PRASUGREL HCL) 1 daily  #90 x 3   Entered and Authorized by:   Minus Breeding MD   Signed by:   Minus Breeding MD on 11/15/2009   Method used:   Electronically to        [facility]* (mail-order)             ,          Ph: [phone]       Fax: [phone]   RxID:   [id number] TRAMADOL HCL 50 MG TABS (TRAMADOL HCL) 1 q4h prn pain  #200 x 3   Entered and Authorized by:   Minus Breeding MD   Signed by:   Minus Breeding MD on 11/15/2009   Method used:   Electronically to        [facility]* (mail-order)             ,          Ph: [phone]       Fax: [phone]   RxID:   [id number] BENICAR 40 MG TABS (OLMESARTAN MEDOXOMIL) 1 tab by mouth once daily  #90 x 3   Entered and Authorized by:   Minus Breeding MD   Signed by:   Minus Breeding MD on 11/15/2009   Method used:   Electronically to        [facility]* (mail-order)             ,          Ph: [phone]       Fax: [phone]   RxID:   [id number] OMEPRAZOLE 20 MG CPDR (OMEPRAZOLE) 2 by mouth once daily  #180 x 3   Entered and Authorized by:   Minus Breeding MD   Signed by:   Minus Breeding MD on 11/15/2009   Method used:   Electronically to        [facility]* (mail-order)             ,          Ph: [phone]       Fax: [phone]   RxID:   [id number] PLETAL 100 MG  TABS (CILOSTAZOL) Take 1 tablet by mouth once a day  #90 x 3   Entered and Authorized by:   Minus Breeding MD   Signed by:   Minus Breeding MD on 11/15/2009   Method used:   Electronically to        [facility]* (mail-order)             ,          Ph: [phone]       Fax: [phone]   RxID:   [id number] FUROSEMIDE 40 MG  TABS (FUROSEMIDE) take 1 by  mouth once daily  #90 x 3   Entered and Authorized by:   Minus Breeding MD   Signed by:   Minus Breeding MD on 11/15/2009   Method used:   Electronically to        [facility]* (mail-order)             ,          Ph: [phone]  Fax: [phone]   RxID:   [id number] DOXYCYCLINE HYCLATE 100 MG CAPS (DOXYCYCLINE HYCLATE) 1 two times a day  #14 x 0   Entered and Authorized by:   Minus Breeding MD   Signed by:   Minus Breeding MD on 11/15/2009   Method used:   [facility] to        [id number]       [address]       [place], [place]  [address]       Ph: [phone] or [phone]       Fax: [phone]   RxID:   [id number]   Laboratory Results   Urine Tests    Routine Urinalysis   Color: lt. yellow Appearance: Clear Glucose: negative   (Normal Range: Negative) Bilirubin: negative   (Normal Range: Negative) Ketone: negative   (Normal Range: Negative) Spec. Gravity: 1.010   (Normal Range: 1.003-1.035) Blood: negative   (Normal Range: Negative) pH: 5.0   (Normal Range: 5.0-8.0) Protein: negative   (Normal Range: Negative) Urobilinogen: 0.2   (Normal Range: 0-1) Nitrite: negative   (Normal Range: Negative) Leukocyte Esterace: negative   (Normal Range: Negative)       Appended Document: BOIL/BUTTOCK--STC Pt states MD did not give him RX for Alprazolam and he needs a 90 day supply sent to [facility]? Please advise?  Appended Document: BOIL/BUTTOCK--STC i remember giving him the rx, and explaining to him why it could not be sent electronically, as the other rxs were.  please review the papers which were given to you at the ov.  as a controlled substance, a duplicate prescription cannot be made.  Appended Document: BOIL/BUTTOCK--STC Pt states he never received any other paperwork from MD that all the paperwork has is [facility]. I tried to explain to Mr.Dever that it would be on a seperate sheet of paper and he  stated "yes I know it would be on a blue piece of paper", I said know it would be on a white sheet of paper. Pt stated he is not going through this again with MD, if MD doesn't want to service him to let him know and he will go else where. I tried to inform pt that it is not aloud to reprint a controlled substance RX and pt just wants to keep repeating I'm not going through this MD again like I did last year.  Appended Document: BOIL/BUTTOCK--STC noted, thank you.  Appended Document: BOIL/BUTTOCK--STC Mr. Peine called and left a message stating that pharmacy only received one RX. And that MD could admit to his mistake, then he hung up the phone. I returned the call to Mr. Oleson and he stated that MD told him (pt) and his grandson that the [facility]? I informed pt that the RX was printed and given to him. He stated that if he had the RX he would of got the Alprazolam refilled because he needs that medication. I tried to inform the pt that it could not be printed again and that people say they lost there RX's all the time so they can get 2 RX's. I did tell him thats not what we thought he was doing, but it is a rule that the RX can't be printed again?  Appended Document: BOIL/BUTTOCK--STC noted, thank you

## 2010-10-01 NOTE — Progress Notes (Signed)
Summary: OV due  Phone Note Outgoing Call Call back at [facility] Phone [phone]   Call placed by: Brenton Grills MA,  March 20, 2010 3:59 PM Call placed to: Patient Details for Reason: OV due Summary of Call: Per MD, OV due--informed pt that office visit is due--pt stated that he would callback office to schedule OV

## 2010-10-01 NOTE — Miscellaneous (Signed)
Summary: Substances Contract  Substances Contract   Imported By: Lester Powhatan 06/03/2010 07:48:36  _____________________________________________________________________  External Attachment:    Type:   Image     Comment:   External Document

## 2010-10-01 NOTE — Progress Notes (Signed)
Summary: alprazolam  Phone Note Refill Request Message from:  Fax from Pharmacy on May 27, 2010 4:13 PM  Refills Requested: Medication #1:  ALPRAZOLAM 0.5 MG TBDP at bedtime as needed sleep   Dosage confirmed as above?Dosage Confirmed   Notes[facility] [address] [phone] Initial call taken by: Brenton Grills MA,  May 27, 2010 4:13 PM  Follow-up for Phone Call        i printed ov is due Follow-up by: Minus Breeding MD,  May 27, 2010 5:00 PM  Additional Follow-up for Phone Call Additional follow up Details #1::        faxed to [facility] Informed pt that he was due for OV. Pt stated that he would call in to make an appointment. Additional Follow-up by: Brenton Grills MA,  May 28, 2010 8:17 AM    Prescriptions: ALPRAZOLAM 0.5 MG TBDP (ALPRAZOLAM) at bedtime as needed sleep  #30 x 0   Entered and Authorized by:   Minus Breeding MD   Signed by:   Minus Breeding MD on 05/27/2010   Method used:   Print then Give to Patient   RxID:   [id number]

## 2010-10-01 NOTE — Progress Notes (Signed)
  Phone Note Refill Request Message from:  Fax from Pharmacy on October 12, 2009 1:15 PM  Refills Requested: Medication #1:  EFFIENT 10 MG TABS 1 daily.   Dosage confirmed as above?Dosage Confirmed Initial call taken by: Josph Macho CMA,  October 12, 2009 1:15 PM    Prescriptions: EFFIENT 10 MG TABS (PRASUGREL HCL) 1 daily  #90 x 0   Entered by:   Josph Macho CMA   Authorized by:   Minus Breeding MD   Signed by:   Josph Macho CMA on 10/12/2009   Method used:   Electronically to        [facility]* (mail-order)             ,          Ph: [phone]       Fax: [phone]   RxID:   [id number]

## 2010-10-01 NOTE — Progress Notes (Signed)
Summary: Xanax  Phone Note Call from Patient Call back at Home Phone [phone]   Caller: Patient [phone] Summary of Call: Pt called stating that medication Rxd for sleep is not helping. Pt is requesting to be switched back to Xanax, please advise. Initial call taken by: Margaret Pyle, CMA,  August 05, 2010 3:50 PM  Follow-up for Phone Call        sorry, the  prescription for xanax cannot be replaced.  i will refill when due in late march. Follow-up by: Minus Breeding MD,  August 05, 2010 5:36 PM  Additional Follow-up for Phone Call Additional follow up Details #1::        pt informed of MD's advisment. pt states rx for Hydroxyzine is not working, and he would like a new rx for Alprazolam. Per pt, the pharmacy only gave him a 30 day supply of Alprazolam when he refilled last rx (05/31/2010) and he is due for a refill this month per rx. Please advise Additional Follow-up by: Brenton Grills CMA Duncan Dull),  August 06, 2010 10:36 AM    Additional Follow-up for Phone Call Additional follow up Details #2::    emr shows 2 rxs.  1 for #30, and 1 for #90 Follow-up by: Minus Breeding MD,  August 06, 2010 12:49 PM  Additional Follow-up for Phone Call Additional follow up Details #3:: Details for Additional Follow-up Action Taken: per [facility] Cones from [facility], pt filled rx for #90 on 05/31/2010, rx for #30 was sent to [facility] and per their records, pt hasn't filled meds there since 2010 Additional Follow-up by: Brenton Grills CMA Duncan Dull),  August 06, 2010 1:44 PM  the # 90 has a refill, so including the #30, the next refill is due 12/29/10  Pt advised that Rx for #90 filled 09/30 not due for refill untill 12/30. Rx written 09/26 was not recieved by Pharmacy per Liborio Nixon, Rx was not filled per [facility] registry. Margaret Pyle, CMA  August 07, 2010 12:59 PM

## 2010-10-01 NOTE — Medication Information (Signed)
Summary: [facility]ucose [employer]  [facility] [employer]   Imported By: Sherian Rein 09/27/2009 11:47:33  _____________________________________________________________________  External Attachment:    Type:   Image     Comment:   External Document

## 2010-10-01 NOTE — Progress Notes (Signed)
Summary: alprazolam  Phone Note From Pharmacy   Summary of Call: [facility] [address] sent fax stating that pt is requesting an early refill for Alprazolam-please advise Initial call taken by: Brenton Grills CMA Duncan Dull),  July 10, 2010 3:31 PM  Follow-up for Phone Call        on 05/29/10, it was rx'ed x 3 mos, with 1 refill Follow-up by: Minus Breeding MD,  July 10, 2010 4:58 PM  Additional Follow-up for Phone Call Additional follow up Details #1::        per pharmacy pt states he does not have any more of the 90 tablets they dispensed on 05/31/2010 and wants to refill early Additional Follow-up by: Brenton Grills CMA Duncan Dull),  July 11, 2010 3:58 PM    Additional Follow-up for Phone Call Additional follow up Details #2::    ov is needeed to address why pt is consuming so many of these.  i notice pt cancelled tomorrow's appt.  please reschedule. Follow-up by: Minus Breeding MD,  July 11, 2010 4:46 PM  Additional Follow-up for Phone Call Additional follow up Details #3:: Details for Additional Follow-up Action Taken: left detailed message for pt to reschedule OV ASAP Additional Follow-up by: Brenton Grills CMA Duncan Dull),  July 11, 2010 4:54 PM

## 2010-10-01 NOTE — Progress Notes (Signed)
  Phone Note Refill Request Message from:  Fax from Pharmacy on October 01, 2009 2:46 PM  Refills Requested: Medication #1:  TRAMADOL HCL 50 MG TABS 1 q4h prn pain   Dosage confirmed as above?Dosage Confirmed please advise refill?  Initial call taken by: Josph Macho CMA,  October 01, 2009 2:46 PM  Follow-up for Phone Call        refill x 1. f/u ov is due Follow-up by: Minus Breeding MD,  October 01, 2009 4:27 PM  Additional Follow-up for Phone Call Additional follow up Details #1::        Pt has appt for Feb 3 scheduled. Additional Follow-up by: Josph Macho CMA,  October 01, 2009 4:33 PM    Prescriptions: TRAMADOL HCL 50 MG TABS (TRAMADOL HCL) 1 q4h prn pain  #120 x 1   Entered by:   Josph Macho CMA   Authorized by:   Minus Breeding MD   Signed by:   Josph Macho CMA on 10/01/2009   Method used:   Electronically to        [facility]* (mail-order)             ,          Ph: [phone]       Fax: [phone]   RxID:   [id number]

## 2010-10-01 NOTE — Progress Notes (Signed)
Summary: [employer]  Phone Note Other Incoming   Summary of Call: Received paperwork from [employer]. Paperwork put on MD's desk. Initial call taken by: Josph Macho CMA,  September 20, 2009 8:42 AM     Appended Document: [employer] Faxed completed paperwork and sent a copy to be scanned.

## 2010-10-01 NOTE — Letter (Signed)
Summary: Glucose Supplies/[employer]  Glucose Supplies/[employer]   Imported By: Sherian Rein 03/22/2010 11:06:07  _____________________________________________________________________  External Attachment:    Type:   Image     Comment:   External Document

## 2010-10-01 NOTE — Assessment & Plan Note (Signed)
Summary: see phone note,discuss med/cd   Vital Signs:  Patient profile:   77 year old male Height:      67 inches (170.18 cm) Weight:      194.25 pounds (88.30 kg) BMI:     30.53 O2 Sat:      97 % on Room air Temp:     97.7 degrees F (36.50 degrees C) oral Pulse rate:   84 / minute BP sitting:   140 / 70  (left arm) Cuff size:   regular  Vitals Entered By: Brenton Grills CMA Duncan Dull) (July 12, 2010 4:11 PM)  O2 Flow:  Room air CC: discuss meds/Medicare wellness?/aj Is Patient Diabetic? Yes   Primary Korine Winton:  Brian Breeding MD  CC:  discuss meds/Medicare wellness?/aj.  History of Present Illness: the status of at least 3 ongoing medical problems is addressed today: dm:  no cbg record, but states cbg's are high-100's.  he tolerates meds well. anxiety: pt says he is out of xanax.  he says the med bottle contained only 30 tabs, but the label said 90. dyslipidemia:  he takes and tolerates crestor as rx'ed.      Current Medications (verified): 1)  Alphagan P 0.1 %  Soln (Brimonidine Tartrate) .... Use 1 Drop in Each (R) Eye Qd 2)  Cosopt 2-0.5 %  Soln (Dorzolamide-Timolol) .... Use 1 Drop in Each Eye Two Times A Day Qd 3)  Travatan 0.004 %  Soln (Travoprost) .... Use 1 Drop in Each Eye Qhs 4)  Furosemide 40 Mg  Tabs (Furosemide) .... Take 1 By Mouth Once Daily 5)  Pletal 100 Mg  Tabs (Cilostazol) .... Take 1 Tablet By Mouth Once A Day 6)  Multivitamins   Tabs (Multiple Vitamin) .... Take 1 Tablet By Mouth Once A Day 7)  Omeprazole 20 Mg Cpdr (Omeprazole) .... 2 By Mouth Once Daily 8)  Alprazolam 0.5 Mg Tbdp (Alprazolam) .... At Bedtime As Needed Sleep 9)  Benicar 40 Mg Tabs (Olmesartan Medoxomil) .Marland Kitchen.. 1 Tab By Mouth Once Daily 10)  Tramadol Hcl 50 Mg Tabs (Tramadol Hcl) .Marland Kitchen.. 1 Q4h Prn Pain 11)  Ferrous Sulfate 325 (65 Fe) Mg  Tabs (Ferrous Sulfate) .Marland Kitchen.. 1 Tab By Mouth Once Daily 12)  Effient 10 Mg Tabs (Prasugrel Hcl) .Marland Kitchen.. 1 Daily 13)  Bromocriptine Mesylate 2.5 Mg Tabs  (Bromocriptine Mesylate) .... 1/2 Tab At Bedtime 14)  Onglyza 5 Mg Tabs (Saxagliptin Hcl) .... 1/2 Tab Once Daily 15)  Crestor 40 Mg Tabs (Rosuvastatin Calcium) .Marland Kitchen.. 1 Tab Once Daily  Allergies (verified): 1)  ! Actos (Pioglitazone Hcl)  Past History:  Past Medical History: Last updated: 07/02/2009 PERIPHERAL VASCULAR DISEASE (ICD-443.9) CHEST WALL PAIN, ACUTE (ICD-786.52) ABNORMAL ELECTROCARDIOGRAM (ICD-794.31) RENAL INSUFFICIENCY, CHRONIC (ICD-585.9) HYPERTENSION (ICD-401.9) DIZZINESS (ICD-780.4) ANEMIA, IRON DEFICIENCY (ICD-280.9) HEADACHE (ICD-784.0) NUMBNESS (ICD-782.0) KNEE PAIN, RIGHT (ICD-719.46) SEBACEOUS CYST, INFECTED (ICD-706.2) BACK PAIN, LUMBAR (ICD-724.2) INSOMNIA (ICD-780.52) HYPOSMOLALITY AND/OR HYPONATREMIA (ICD-276.1) COLONIC POLYPS, HX OF (ICD-V12.72) BENIGN PROSTATIC HYPERTROPHY (ICD-600.00) GERD (ICD-530.81) DIABETES MELLITUS, TYPE II (ICD-250.00) THUMB PAIN, RIGHT (ICD-729.5) THIGH PAIN (ICD-729.5)    Review of Systems       anxiety is mild.  gi side-effects limit him to 1/day.  Physical Exam  Additional Exam:  Fructosamine         [H]  353 umol/L (converts to a1c of 7.7). Hemoglobin           [L]  11.8 g/dL  13.0-17.0 Hematocrit           [L]  35.5 %        Iron Saturation      [L]  13.3 %                      20.0-50.0 Cholesterol               158 mg/dL       Impression & Recommendations:  Problem # 1:  DIABETES MELLITUS, TYPE II (ICD-250.00) needs increased rx  Problem # 2:  INSOMNIA (ICD-780.52) i told pt i cannot replace the prescription for xanax  Problem # 3:  ANEMIA, IRON DEFICIENCY (ICD-280.9) needs increased rx  Problem # 4:  DYSLIPIDEMIA (ICD-272.4) well-controlled  Medications Added to Medication List This Visit: 1)  Hydroxyzine Hcl 50 Mg Tabs (Hydroxyzine hcl) .Marland Kitchen.. 1 tab at bedtime as needed for sleep  Other Orders: T-Fructosamine (56213-08657) Pneumococcal Vaccine (84696) Admin 1st Vaccine  (29528) TLB-CBC Platelet - w/Differential (85025-CBCD) TLB-IBC Pnl (Iron/FE;Transferrin) (83550-IBC) TLB-Lipid Panel (80061-LIPID) TLB-Hepatic/Liver Function Pnl (80076-HEPATIC) Est. Patient Level IV (41324)   Patient Instructions: 1)  i cannot replace the prescription for alprazolam, i am sending a prescription for hydroxyzine 50 mg at bedtime as needed for sleep.  blood tests are being ordered for you today.  please call [phone] to hear your test results. 2)  please let me know what your wishes would be, if artificial life support measures should become necessary.  it is critically important to prevent falling down (keep floor areas well-lit, dry, and free of loose objects) 3)  please consider these measures for your health:  minimize alcohol.  do not use tobacco products.  have a colonoscopy at least every 10 years from age 89.  keep firearms safely stored.  always use seat belts.  have working smoke alarms in your home.  see an eye doctor and dentist regularly.  never drive under the influence of alcohol or drugs (including prescription drugs).   4)  good diet and exercise habits significanly improve the control of your diabetes.  please let me know if you wish to be referred to a dietician.  high blood sugar is very risky to your health.  you should see an eye doctor every year. 5)  controlling your blood pressure and cholesterol drastically reduces the damage diabetes does to your body.  this also applies to quitting smoking.  please discuss these with your doctor.  you should take an aspirin every day, unless you have been advised by a doctor not to. 6)  (update: i left message on phone-tree:  rx as we discussed) 7)  increase parlodel to 2.5 mg at bedtime.  increase fe to 1-bid) Prescriptions: HYDROXYZINE HCL 50 MG TABS (HYDROXYZINE HCL) 1 tab at bedtime as needed for sleep  #30 x 1   Entered and Authorized by:   Brian Breeding MD   Signed by:   Brian Breeding MD on 07/12/2010   Method  used:   Electronically to        [facility]. [phone]* (retail)       [address]       [place], [place]  [address]       Ph: [phone]       Fax: [phone]   RxID:   [id number]    Orders Added: 1)  T-Fructosamine [35573-22025] 2)  Pneumococcal Vaccine [42706]  3)  Admin 1st Vaccine [90471] 4)  TLB-CBC Platelet - w/Differential [85025-CBCD] 5)  TLB-IBC Pnl (Iron/FE;Transferrin) [83550-IBC] 6)  TLB-Lipid Panel [80061-LIPID] 7)  TLB-Hepatic/Liver Function Pnl [80076-HEPATIC] 8)  Est. Patient Level IV [45409]   Immunizations Administered:  Pneumonia Vaccine:    Vaccine Type: Pneumovax    Site: left deltoid    Mfr: Merck    Dose: 0.5 ml    Route: IM    Given by: Lamar Sprinkles, CMA    Exp. Date: 12/27/2011    Lot #: [id number]    VIS given: 08/06/09 version given July 12, 2010.   Immunizations Administered:  Pneumonia Vaccine:    Vaccine Type: Pneumovax    Site: left deltoid    Mfr: Merck    Dose: 0.5 ml    Route: IM    Given by: Lamar Sprinkles, CMA    Exp. Date: 12/27/2011    Lot #: [id number]    VIS given: 08/06/09 version given July 12, 2010.

## 2010-10-01 NOTE — Assessment & Plan Note (Signed)
Summary: f/u appt/#/cd   Vital Signs:  Patient profile:   77 year old male Height:      67 inches (170.18 cm) Weight:      192.25 pounds (87.39 kg) BMI:     30.22 O2 Sat:      97 % on Room air Temp:     97.2 degrees F (36.22 degrees C) oral Pulse rate:   83 / minute BP sitting:   112 / 68  (left arm) Cuff size:   regular  Vitals Entered By: Brenton Grills MA (May 30, 2010 1:53 PM)  O2 Flow:  Room air CC: F/U appt//aj Is Patient Diabetic? Yes   Primary Provider:  Minus Breeding MD  CC:  F/U appt//aj.  History of Present Illness: anemia:  he takes fe 1 two times a day pt states few mos of slight lump at the right lateral neck.  no assoc pain.   pt says [place] causes tinnitus.  when he stopped the med, the sxs stopped.   no cbg record, but states cbg's are in the 100's.  he does not take bromocriptine.   pt says he did not receive a prescription for xanax in march, 2011, but computer says it was refilled.     Current Medications (verified): 1)  Januvia 50 Mg  Tabs (Sitagliptin Phosphate) .Marland Kitchen.. 1 By Mouth Once Daily 2)  Alphagan P 0.1 %  Soln (Brimonidine Tartrate) .... Use 1 Drop in Each (R) Eye Qd 3)  Cosopt 2-0.5 %  Soln (Dorzolamide-Timolol) .... Use 1 Drop in Each Eye Two Times A Day Qd 4)  Travatan 0.004 %  Soln (Travoprost) .... Use 1 Drop in Each Eye Qhs 5)  Furosemide 40 Mg  Tabs (Furosemide) .... Take 1 By Mouth Once Daily 6)  Pletal 100 Mg  Tabs (Cilostazol) .... Take 1 Tablet By Mouth Once A Day 7)  Multivitamins   Tabs (Multiple Vitamin) .... Take 1 Tablet By Mouth Once A Day 8)  Omeprazole 20 Mg Cpdr (Omeprazole) .... 2 By Mouth Once Daily 9)  Alprazolam 0.5 Mg Tbdp (Alprazolam) .... At Bedtime As Needed Sleep 10)  Benicar 40 Mg Tabs (Olmesartan Medoxomil) .Marland Kitchen.. 1 Tab By Mouth Once Daily 11)  Tramadol Hcl 50 Mg Tabs (Tramadol Hcl) .Marland Kitchen.. 1 Q4h Prn Pain 12)  Ferrous Sulfate 325 (65 Fe) Mg  Tabs (Ferrous Sulfate) .Marland Kitchen.. 1 Tab By Mouth Once Daily 13)  Effient 10  Mg Tabs (Prasugrel Hcl) .Marland Kitchen.. 1 Daily 14)  Doxycycline Hyclate 100 Mg Caps (Doxycycline Hyclate) .Marland Kitchen.. 1 Two Times A Day 15)  Bromocriptine Mesylate 2.5 Mg Tabs (Bromocriptine Mesylate) .... 1/2 Tab At Bedtime  Allergies (verified): 1)  ! Actos (Pioglitazone Hcl)  Past History:  Past Medical History: Last updated: 07/02/2009 PERIPHERAL VASCULAR DISEASE (ICD-443.9) CHEST WALL PAIN, ACUTE (ICD-786.52) ABNORMAL ELECTROCARDIOGRAM (ICD-794.31) RENAL INSUFFICIENCY, CHRONIC (ICD-585.9) HYPERTENSION (ICD-401.9) DIZZINESS (ICD-780.4) ANEMIA, IRON DEFICIENCY (ICD-280.9) HEADACHE (ICD-784.0) NUMBNESS (ICD-782.0) KNEE PAIN, RIGHT (ICD-719.46) SEBACEOUS CYST, INFECTED (ICD-706.2) BACK PAIN, LUMBAR (ICD-724.2) INSOMNIA (ICD-780.52) HYPOSMOLALITY AND/OR HYPONATREMIA (ICD-276.1) COLONIC POLYPS, HX OF (ICD-V12.72) BENIGN PROSTATIC HYPERTROPHY (ICD-600.00) GERD (ICD-530.81) DIABETES MELLITUS, TYPE II (ICD-250.00) THUMB PAIN, RIGHT (ICD-729.5) THIGH PAIN (ICD-729.5)    Review of Systems  The patient denies hematochezia and hematuria.    Physical Exam  General:  normal appearance.   Skin:  there is a 2 cm sebaceous cyst at the right neck Additional Exam:  Hemoglobin A1C   8.6 %   Hemoglobin           [  L]  12.6 g/dL                   40.9-81.1 Hematocrit           [L]  36.6 %     Iron Saturation      [L]  13.1 %  Cholesterol LDL  232.9 mg/dL   Impression & Recommendations:  Problem # 1:  SEBACEOUS CYST,  not infected  Problem # 2:  DIABETES MELLITUS, TYPE II (ICD-250.00) needs increased rx  Problem # 3:  INSOMNIA (ICD-780.52) well-controlled  Problem # 4:  ANEMIA, IRON DEFICIENCY (ICD-280.9)  Problem # 5:  DYSLIPIDEMIA (ICD-272.4) needs increased rx  Medications Added to Medication List This Visit: 1)  Onglyza 5 Mg Tabs (Saxagliptin hcl) .... 1/2 tab once daily 2)  Crestor 40 Mg Tabs (Rosuvastatin calcium) .Marland Kitchen.. 1 tab once daily  Other Orders: T-Parathyroid Hormone,  Intact w/ Calcium (91478-29562) Flu Vaccine 53yrs + MEDICARE PATIENTS (Z3086) Administration Flu vaccine - MCR (G0008) TLB-Lipid Panel (80061-LIPID) TLB-BMP (Basic Metabolic Panel-BMET) (80048-METABOL) TLB-CBC Platelet - w/Differential (85025-CBCD) TLB-Hepatic/Liver Function Pnl (80076-HEPATIC) TLB-TSH (Thyroid Stimulating Hormone) (84443-TSH) TLB-IBC Pnl (Iron/FE;Transferrin) (83550-IBC) TLB-A1C / Hgb A1C (Glycohemoglobin) (83036-A1C) TLB-Udip w/ Micro (81001-URINE) TLB-PSA (Prostate Specific Antigen) (84153-PSA) Est. Patient Level IV (57846)  Patient Instructions: 1)  tests are being ordered for you today.  a few days after the test(s), please call [phone] to hear your test results. 2)  the lump at the right neck needs no treatment unless it becomes painful.  call us if so.  3)  Please schedule a physical appointment in 6 weeks. 4)  check your blood sugar 1 time a day.  vary the time of day when you check, between before the 3 meals, and at bedtime.  also check if you have symptoms of your blood sugar being too high or too low.  please keep a record of the readings and bring it to your next appointment here.  please call us sooner if you are having low blood sugar episodes. 5)  change januvia to onglyza 1/2 of 5 mg each am. 6)  add bromocriptine 1/2 of 2.5 mg at bedtime. 7)  here are some tests for blood in the bowels.  please follow instructions, and return to the lab. 8)  (update: i left message on phone-tree:  i sent rx for crestor 40/d.  increase dm meds as rx'ed.  increase fe to 1 two times a day). Prescriptions: CRESTOR 40 MG TABS (ROSUVASTATIN CALCIUM) 1 tab once daily  #90 x 3   Entered and Authorized by:   Minus Breeding MD   Signed by:   Minus Breeding MD on 05/30/2010   Method used:   Faxed to ...       [facility] (mail-order)             , [place]         Ph: [phone]       Fax: [phone]   RxID:   [id number] ALPRAZOLAM 0.5 MG TBDP (ALPRAZOLAM) at bedtime as needed  sleep  #90 x 1   Entered and Authorized by:   Minus Breeding MD   Signed by:   Minus Breeding MD on 05/30/2010   Method used:   Print then Mail to Patient   RxID:   [id number] BROMOCRIPTINE MESYLATE 2.5 MG TABS (BROMOCRIPTINE MESYLATE) 1/2 tab at bedtime  #45 x 3   Entered and Authorized by:   Minus Breeding MD  Signed by:   Minus Breeding MD on 05/30/2010   Method used:   Faxed to ...       [facility] (mail-order)             , [place]         Ph: [phone]       Fax: [phone]   RxID:   [id number]                  Flu Vaccine Consent Questions     Do you have a history of severe allergic reactions to this vaccine? no    Any prior history of allergic reactions to egg and/or gelatin? no    Do you have a sensitivity to the preservative Thimersol? no    Do you have a past history of Guillan-Barre Syndrome? no    Do you currently have an acute febrile illness? no    Have you ever had a severe reaction to latex? no    Vaccine information given and explained to patient? yes    Are you currently pregnant? no    Lot Number:[id number]   Site Given  Left Deltoid IM

## 2010-10-01 NOTE — Miscellaneous (Signed)
Summary: [facility]   Imported By: Lester Shadyside 11/21/2009 07:46:07  _____________________________________________________________________  External Attachment:    Type:   Image     Comment:   External Document

## 2010-10-01 NOTE — Miscellaneous (Signed)
Summary: Drainage of boil RT buttocks/[facility]  Drainage of boil RT buttocks/[facility]   Imported By: Sherian Rein 11/19/2009 13:30:26  _____________________________________________________________________  External Attachment:    Type:   Image     Comment:   External Document

## 2010-10-01 NOTE — Progress Notes (Signed)
Summary: rx refill req  Phone Note Refill Request Message from:  Patient on June 18, 2010 4:54 PM  Refills Requested: Medication #1:  ONGLYZA 5 MG TABS 1/2 tab once daily   Dosage confirmed as above?Dosage Confirmed  Method Requested: Electronic Next Appointment Scheduled: 07/12/2010 Initial call taken by: Brenton Grills MA,  June 18, 2010 4:54 PM    Prescriptions: ONGLYZA 5 MG TABS (SAXAGLIPTIN HCL) 1/2 tab once daily  #15 x 3   Entered by:   Brenton Grills MA   Authorized by:   Minus Breeding MD   Signed by:   Brenton Grills MA on 06/18/2010   Method used:   Electronically to        [facility]* (retail)       [address]       [place], [place]  [address]       Ph: [phone] or [phone]       Fax: [phone]   RxID:   [id number]

## 2010-10-03 NOTE — Progress Notes (Signed)
Summary: rx refill req  Phone Note Call from Patient Call back at Home Phone [phone]   Caller: Patient Summary of Call: Per pt, he is out of Effient and needs rx sent to [facility] for 90 day supply and also to [facility] on [address] until rx for [facility] comes in Initial call taken by: Brenton Grills CMA Duncan Dull),  September 17, 2010 2:55 PM  Follow-up for Phone Call        pt informed Follow-up by: Brenton Grills CMA Duncan Dull),  September 17, 2010 2:57 PM    Prescriptions: EFFIENT 10 MG TABS (PRASUGREL HCL) 1 daily  #30 x 0   Entered by:   Brenton Grills CMA (AAMA)   Authorized by:   Minus Breeding MD   Signed by:   Brenton Grills CMA (AAMA) on 09/17/2010   Method used:   Electronically to        [facility]. [phone]* (retail)       [address]       [place], [place]  [address]       Ph: [phone]       Fax: [phone]   RxID:   [id number] EFFIENT 10 MG TABS (PRASUGREL HCL) 1 daily  #90 x 1   Entered by:   Brenton Grills CMA (AAMA)   Authorized by:   Minus Breeding MD   Signed by:   Brenton Grills CMA (AAMA) on 09/17/2010   Method used:   Electronically to        [facility]* (retail)             ,          Ph: [phone]       Fax: [phone]   RxID:   [id number]

## 2010-10-03 NOTE — Medication Information (Signed)
Summary: Patient RX History / [place] [facility]  Patient RX History / [place] [facility]   Imported By: Lennie Odor 08/09/2010 15:21:05  _____________________________________________________________________  External Attachment:    Type:   Image     Comment:   External Document

## 2010-10-03 NOTE — Medication Information (Signed)
Summary: Diabetes Supplies / [facility]  Diabetes Supplies / [facility]   Imported By: Lennie Odor 09/20/2010 15:16:39  _____________________________________________________________________  External Attachment:    Type:   Image     Comment:   External Document

## 2010-11-11 ENCOUNTER — Ambulatory Visit: Payer: Self-pay | Admitting: Endocrinology

## 2010-11-11 DIAGNOSIS — Z0289 Encounter for other administrative examinations: Secondary | ICD-10-CM

## 2010-12-08 ENCOUNTER — Other Ambulatory Visit: Payer: Self-pay | Admitting: Endocrinology

## 2010-12-26 ENCOUNTER — Other Ambulatory Visit (INDEPENDENT_AMBULATORY_CARE_PROVIDER_SITE_OTHER): Payer: Medicare Other

## 2010-12-26 ENCOUNTER — Ambulatory Visit (INDEPENDENT_AMBULATORY_CARE_PROVIDER_SITE_OTHER): Payer: Medicare Other | Admitting: Endocrinology

## 2010-12-26 ENCOUNTER — Encounter: Payer: Self-pay | Admitting: Endocrinology

## 2010-12-26 DIAGNOSIS — D509 Iron deficiency anemia, unspecified: Secondary | ICD-10-CM

## 2010-12-26 DIAGNOSIS — E119 Type 2 diabetes mellitus without complications: Secondary | ICD-10-CM

## 2010-12-26 LAB — IBC PANEL
Iron: 37 ug/dL — ABNORMAL LOW (ref 42–165)
Saturation Ratios: 12.5 % — ABNORMAL LOW (ref 20.0–50.0)

## 2010-12-26 LAB — CBC WITH DIFFERENTIAL/PLATELET
Basophils Absolute: 0 10*3/uL (ref 0.0–0.1)
Basophils Relative: 0.4 % (ref 0.0–3.0)
Eosinophils Absolute: 0.1 10*3/uL (ref 0.0–0.7)
Eosinophils Relative: 1.5 % (ref 0.0–5.0)
Lymphs Abs: 2.3 10*3/uL (ref 0.7–4.0)
MCHC: 33.8 g/dL (ref 30.0–36.0)
Monocytes Absolute: 0.5 10*3/uL (ref 0.1–1.0)
Neutro Abs: 3.3 10*3/uL (ref 1.4–7.7)
Neutrophils Relative %: 52.8 % (ref 43.0–77.0)
Platelets: 378 10*3/uL (ref 150.0–400.0)
RBC: 4.39 Mil/uL (ref 4.22–5.81)
WBC: 6.3 10*3/uL (ref 4.5–10.5)

## 2010-12-26 LAB — HEMOGLOBIN A1C: Hgb A1c MFr Bld: 9 % — ABNORMAL HIGH (ref 4.6–6.5)

## 2010-12-26 MED ORDER — LOSARTAN POTASSIUM 100 MG PO TABS: 100.0000 mg | ORAL_TABLET | Freq: Every day | ORAL | Status: DC

## 2010-12-26 NOTE — Patient Instructions (Addendum)
Change benicar to losartan 40 mg daily. Your next physical is due in 7 months. blood tests are being ordered for you today.  please call [phone] to hear your test results. (update: i left message on phone-tree:  Increase fe to 1 bid.  You should take insulin).

## 2010-12-26 NOTE — Progress Notes (Signed)
  Subjective:    Patient ID: Brian Wang, male    DOB: 04/16/1923, 77 y.o.   MRN: [id number]  HPI The state of at least three ongoing medical problems is addressed today: Anemia: Pt takes fe 1/day. Denies brbpr Dm: no cbg record, but states cbg's are well-controlled. Htn: Denies chest pain and sob. Past Medical History  Diagnosis Date  . DIABETES MELLITUS, TYPE II 05/13/2007  . CHEST WALL PAIN, ACUTE 01/10/2008  . RENAL INSUFFICIENCY, CHRONIC 05/29/2009  . HYPERTENSION 05/13/2007  . ANEMIA, IRON DEFICIENCY 10/25/2008  . INSOMNIA 07/22/2007  . Hyposmolality and/or hyponatremia 07/22/2007  . COLONIC POLYPS, HX OF 05/13/2007  . BENIGN PROSTATIC HYPERTROPHY 05/13/2007  . GERD 05/13/2007  . DYSLIPIDEMIA 05/30/2010  . PERIPHERAL VASCULAR DISEASE 05/13/2007   Past Surgical History  Procedure Date  . Suprapubic prostatectomy 08/01/2008    Su Grand MD  . Cholecystectomy 03/13/2003    Abigail Miyamoto MD    reports that he has quit smoking. He does not have any smokeless tobacco history on file. He reports that he does not drink alcohol or use illicit drugs. family history includes Heart disease in his mother. Allergies  Allergen Reactions  . Pioglitazone     REACTION: edema    Review of Systems denies hypoglycemia and hematuria.    Objective:   Physical Exam GENERAL: no distress.  Obese Pulses: dorsalis pedis intact bilat.   Feet: no deformity.  no ulcer on the feet.  feet are of normal color and temp.  no edema Neuro: sensation is intact to touch on the feet     Lab Results  Component Value Date   WBC 6.3 12/26/2010   HGB 12.3* 12/26/2010   HCT 36.4* 12/26/2010   PLT 378.0 12/26/2010   CHOL 158 07/12/2010   TRIG 154.0* 07/12/2010   HDL 50.90 07/12/2010   LDLDIRECT 232.9 05/30/2010   ALT 17 07/12/2010   AST 26 07/12/2010   NA 138 05/30/2010   K 3.9 05/30/2010   CL 103 05/30/2010   CREATININE 1.8* 05/30/2010   BUN 29* 05/30/2010   CO2 27 05/30/2010   TSH 2.09 05/30/2010   PSA  1.94 05/30/2010   HGBA1C 9.0* 12/26/2010    Assessment & Plan:  Dm, needs increased rx fe-deficiency anemia, needs increased rx Htn: pt requests less expensive med.

## 2011-01-02 ENCOUNTER — Other Ambulatory Visit: Payer: Self-pay | Admitting: Endocrinology

## 2011-01-06 ENCOUNTER — Other Ambulatory Visit: Payer: Self-pay | Admitting: Endocrinology

## 2011-01-14 NOTE — H&P (Signed)
NAME:  Brian Wang, Brian Wang               ACCOUNT NO.:  [id number]   MEDICAL RECORD NO.:  [id number]          PATIENT TYPE:  INP   LOCATION:  0011                         FACILITY:  [facility]   PHYSICIAN:  Lindaann Slough, M.D.  DATE OF BIRTH:  1923-03-03   DATE OF ADMISSION:  08/01/2008  DATE OF DISCHARGE:                              HISTORY & PHYSICAL   CHIEF COMPLAINT:  Inability to urinate.   HISTORY OF PRESENT ILLNESS:  Patient is an 77 year old male who went  into urinary retention 2 months ago.  He failed 2 voiding trials since.  Prostate ultrasound showed a volume of 196 mL.  Cystoscopy showed  markedly enlarged prostate with a prostatic urethral length of 8 cm.  Patient has been on Flomax and finasteride.  Treatment options were  discussed with the patient.  I told him that in view of the volume of  the prostate it would be better to do an open prostatectomy.  The risks  and benefits of the procedure were discussed with him and he understands  and agrees to proceed.   PAST MEDICAL HISTORY:  Positive for:  1. Diabetes.  2. Glaucoma, hypercholesterolemia.  3. Hypertension.   PAST SURGICAL HISTORY:  Cholecystectomy.   MEDICATIONS:  1. Actos.  2. Finasteride 5 mg.  3. Glucophage XR 500 mg.  4. Hyzaar.  5. Plendil.   ALLERGIES:  NO KNOWN DRUG ALLERGIES   SOCIAL HISTORY:  He is married and does not smoke nor drink.   FAMILY HISTORY:  Positive for diabetes and hypertension.   REVIEW OF SYSTEMS:  As noted in the HPI and everything else is negative.   PHYSICAL EXAMINATION:  This is a well-developed, 77 year old male who is  in no acute distress at this time.  He is alert and oriented to time,  place, and person.  Blood pressure is 141/70.  Pulse 74.  Respirations  18.  Temperature 98.3.  HEAD:  Normal.  He has pink conjunctivae.  EARS, NOSE, AND THROAT:  Within normal limits.  NECK:  Supple.  No cervical adenopathy.  No thyromegaly.  CHEST:  Symmetrical.  LUNGS:  Fully  expanded and clear to percussion and auscultation.  HEART:  Regular rhythm.  ABDOMEN:  Soft, obese, nontender.  He has no CVA tenderness.  Kidneys  are not palpable.  He has no hepatomegaly nor splenomegaly.  Bowel  sounds are normal.  GENITALIA:  Penis and scrotal contents are within normal limits.  He now  has a Foley catheter that is draining clear urine.  RECTAL EXAMINATION:  Sphincter tone is normal.  Prostate is markedly  enlarged without any nodules and seminal vesicles are not palpable.   IMPRESSION:  1. Acute urinary retention.  2. Benign prostatic hypertrophy.  3. Diabetes.  4. Hypertension.      Lindaann Slough, M.D.  Electronically Signed     MN/MEDQ  D:  08/01/2008  T:  08/01/2008  Job:  [id number]

## 2011-01-14 NOTE — Discharge Summary (Signed)
NAME:  Brian Wang, Brian Wang               ACCOUNT NO.:  [id number]   MEDICAL RECORD NO.:  [id number]          PATIENT TYPE:  INP   LOCATION:  4703                         FACILITY:  [facility]   PHYSICIAN:  Raenette Rover. Felicity Coyer, MDDATE OF BIRTH:  11-04-1922   DATE OF ADMISSION:  06/24/2007  DATE OF DISCHARGE:  06/30/2007                               DISCHARGE SUMMARY   DISCHARGE DIAGNOSES:  1. Leukocytosis.  In setting of facial pain likely secondary to      sinusitis, clinically improved with antibiotics.  Plan to continue      total of 10-day treatment.  2. Nausea likely secondary to constipation, resolved.  3. Hyponatremia/hypochloremia on admission, question borderline SIADH,      resolved.  Will need close outpatient follow-up  4. Hyperkalemia, resolved.  5. Chronic diastolic heart failure compensated.  6. Diabetes type 2, stable on home meds  7. Benign prostatic hypertrophy  8. Peripheral neuropathy  9. History of degenerative joint disease, osteoarthritis  10.Acute on chronic renal insufficiency, resolved.   HISTORY OF PRESENT ILLNESS:  Brian Wang is a 77 year old male who was  admitted on June 24, 2007 with chief complaint of nausea, vomiting.  He was also noted to be hypotensive on admission.  He noted a moderate  headache on admission.  He was admitted for further evaluation and  treatment.   PAST MEDICAL HISTORY:  1. Gastroesophageal reflux disease  2. Glaucoma  3. Hypertension.  4. Diabetes type 2.  5. Peripheral arterial disease  6. Benign prostatic hypertrophy  7. Colonic polyps  8. Recent negative adenosine Cardiolite.   COURSE OF HOSPITALIZATION:  1. Leukocytosis with facial pain.  The patient was admitted.  He was      started on IV Unasyn initially as we were concerned about      possibility of pancreatitis with the patient's nausea.  However,      abdominal ultrasound showed no acute abnormalities. As the patient      continued with complaints of facial pain, he  was continued on      antibiotics for presumed sinusitis.  He is clinically improved.      Plan to continue Augmentin for a total of 10-day treatment.  2. Hyponatremia/hypochloremia.  The patient was noted to have a sodium      of 120 at time of admission he was given IV hydration with normal      saline and his sodium level at time of discharge is 134.  He did      undergo urine sodium study with a value of 20 and urine osmolality      of 120.  Serum osmolality was 261, question mild SIADH.  This will      need close outpatient follow-up.  3. Hyperkalemia.  The patient was not noted to be on any potassium      supplementation or  ACE inhibitor prior to this admission.      Potassium at time of discharge is 4.9.   The patient was seen by physical therapy, recommended skilled nursing  facility versus home health physical therapy/  the  patient requested a  short-term stay the facility for additional rehabilitation prior to  discharge to home as he is primary caregiver for his wife.   PHYSICAL EXAM:  BP 157/85, heart rate 81, respiratory rate 18,  temperature 97.5, O2 sat 99% on room air.  GENERAL: The patient is an  elderly male who is awake and alert and in no acute distress.  CARDIOVASCULAR:  S1, S2.  Regular rate and rhythm.  LUNGS were clear to auscultation bilaterally with no wheezes, rales or  rhonchi.  ABDOMEN: Soft, nontender, nondistended.  Positive bowel sounds  are noted.  EXTREMITIES:  Showed no peripheral edema.  NEURO:.  The patient with positive facial symmetry.  Speech is clear, he  is moving all extremities.  He was alert, oriented x4.  He answers  questions appropriately.   PERTINENT LABORATORY DATA:  At time of discharge,  BUN 6, creatinine  1.37, hemoglobin 11.7, hematocrit 34.5.   DISPOSITION:  The patient will be discharged to skilled nursing facility  upon discharge and as needed.  He will need follow-up with his primary  care Brian Wang.    MEDICATIONS:  At time of discharge:  1. Protonix 80 mg p.o. daily.  2. Trusopt 2% ophthalmic solution 1 drop to both eyes twice daily.  3. Plavix 75 mg p.o. daily.  4. Lovenox 40 mg subcu daily injections for DVT prophylaxis.  5. Senokot 1 tablet p.o. q.h.s.  6. Aspirin 81 mg p.o. daily.  7. Atacand 1 drop to right eye twice daily.  8. Travatan 1 drop to both eyes twice daily.  9. Proscar 5 mg p.o. q.h.s.  10.Flomax 0.4 mg p.o. q.h.s.  11.MiraLax 17 grams p.o. daily  12.Augmentin 875 mg p.o. b.i.d.  13.Cozaar 100 mg p.o. daily.  14.Plendil 5 mg p.o. daily  15.Januvia 50 mg p.o. daily  16.Lyrica 75 mg p.o. daily.  17.Glucophage 500 mg p.o. b.i.d.  18.Darvocet 1 tablet p.o. q.4 h p.r.n.  19.Ambien 5 mg p.o. q.h.s. p.r.n.   Please note, this is a correction to what was stated above.  The patient  was on a potassium supplement, K-Dur 20 milliequivalents p.o. b.i.d.  which has been placed on hold during this admission.  Plan to continue  off  K-Dur with follow-up BMET at the facility upon discharge as needed.  He will need follow-up with Brian Wang, his  primary care Brian Wang  at [facility].  this is.      Brian Craze, NP      Raenette Rover. Felicity Coyer, MD  Electronically Signed    MO/MEDQ  D:  06/30/2007  T:  06/30/2007  Job:  [id number]   cc:   Gregary Signs A. Everardo All, MD

## 2011-01-14 NOTE — Op Note (Signed)
NAME:  Brian Wang, Brian Wang               ACCOUNT NO.:  [id number]   MEDICAL RECORD NO.:  [id number]          PATIENT TYPE:  AMB   LOCATION:  ENDO                         FACILITY:  [facility]   PHYSICIAN:  Shirley Friar, MDDATE OF BIRTH:  [birth year]   DATE OF PROCEDURE:  04/07/2007  DATE OF DISCHARGE:                               OPERATIVE REPORT   PROCEDURE PERFORMED:  Colonoscopy.   INDICATION:  Colon polyp, anemia.   MEDICATIONS:  Fentanyl 60 mcg IV, Versed 6 mg IV, saline injection 33  mL.   FINDINGS:  An adult Pentax colonoscope was inserted into a well prepped  colon and advanced to the cecum where the ileocecal valve and  appendiceal orifice were identified.  The previously noted polypoid  cecal lesion was seen at the level of the ileocecal valve across from  but not touching it.  A total of 33 mL of saline were injected in and  around this polypoid lesion to try and help with lifting it off the  wall.  Snare cautery was used in multiple attempts to remove this  lesion, but it was only partially removed due to the size and location.  There was a small amount of post polypectomy bleeding noted but this  spontaneously resolved.  Complete resection was not possible with the  colonoscope despite a large amount of saline injection for attempted  mucosectomy.  The parts that were removed with snare cautery were  retrieved to be sent for histology.  On further withdrawal of the  colonoscope, no additional polyps were seen.   ASSESSMENT:  Large polypoid lesion in the cecum that, on previous  biopsy, showed tubulovillous adenoma - partial resection today with  piecemeal snare cautery and saline injection.   PLAN:  1. Follow-up on path.  Most likely will need surgical removal due to      size and location of polyp.  Argon plasma coagulation not attempted      due to the large amount of polyp remaining in the colon preventing      adequate use of APC.  2. Continue to hold Plavix  another seven days.      Shirley Friar, MD  Electronically Signed     VCS/MEDQ  D:  04/07/2007  T:  04/07/2007  Job:  [id number]   cc:   Mina Marble, M.D.  Fax: [phone]   Lilla Shook, M.D.  Fax: [phone]

## 2011-01-14 NOTE — Discharge Summary (Signed)
Brian Wang, IYER               ACCOUNT NO.:  [id number]   MEDICAL RECORD NO.:  [id number]          PATIENT TYPE:  INP   LOCATION:  1434                         FACILITY:  [facility]   PHYSICIAN:  Lindaann Slough, M.D.  DATE OF BIRTH:  10-13-1922   DATE OF ADMISSION:  08/01/2008  DATE OF DISCHARGE:  08/04/2008                               DISCHARGE SUMMARY   DISCHARGE DIAGNOSIS:  1. Urinary retention.  2. Benign prostatic hypertrophy.  3. Diabetes.  4. Hypertension.   PROCEDURE:  Suprapubic prostatectomy on August 01, 2008.   The patient is an 77 year old male who went into urinary retention about  2 months ago.  He failed 2 voiding trials.  He had been on Flomax and  finasteride.  Prostate ultrasound showed a prostate volume of 196 mL.  Cystoscopy showed markedly enlarged prostate with trabeculated bladder.  The patient was admitted on August 01, 2008 for suprapubic  prostatectomy.   PHYSICAL EXAMINATION:  Blood pressure was 141/70, pulse 74, respirations  18, temperature 98.3.  LUNGS:  Clear.  HEART: Regular rhythm.  ABDOMEN: Soft and obese, nontender, no CVA tenderness.  No hepatomegaly,  no splenomegaly.  Bowel sounds normal.   Hemoglobin on admission was 12.1, hematocrit 36.1, WBC 6.6.  BUN 21,  creatinine 1.47, sodium 139, potassium 3.9.  EKG showed a normal sinus  rhythm.  Chest x-ray in May 2009 showed no active disease.   The patient had suprapubic prostatectomy on August 01, 2008.  Postop  course was uneventful.  He remained afebrile.  He had nausea and  vomiting the first day postop.  However, that cleared up by the second  day and he was started on liquid diet that he tolerated well.  The  catheter was draining slightly bloody urine.  His hemoglobin on August 02, 2008 was 8.8, hematocrit 26.7, WBC 9.0, BUN 22, creatinine 1.48.  He  remained afebrile throughout the hospital course and on December 4 he  was eating well.  He had bowel movement.  The catheters  were draining  well.  Urine culture preoperatively was positive for Pseudomonas and  Enterococcus.  He was then treated with Cipro and vancomycin.  On  August 03, 2008 he was afebrile.  He was then discharged home.   DISCHARGE MEDICATIONS:  1. Cipro 500 mg twice a day.  2. Vicodin 5/500 1 or 2 tablets q.6 h p.r.n. for pain.  3. Plendil ER 5 mg daily.  4. Cozaar 100 mg daily.  5. Furosemide 40 mg a day.  6. Glumetza ER 500 mg twice a day.  7. Januvia 50 mg daily.  8. Glimepiride 1 mg a day.  9. Nexium 40 mg a day.  10.Zolpidem 10 mg p.r.n.  11.Multivitamin one a day.  12.Dorzolamide/timolol 1 drop both eyes twice a day.  13.Alphagan 1 drop right eye twice a day.  14.Travatan 1 drop both eyes at bedtime.   The patient is instructed not to do any lifting, straining or driving  until further advised. Discharge diet:  1800 calorie diabetic diet.  The  Foley catheter will be clamped and the SP  tube will be left to straight  drainage.   CONDITION ON DISCHARGE:  Improved.   He will return to the office next week for Foley catheter removal and  skin staple removal.      Lindaann Slough, M.D.  Electronically Signed     MN/MEDQ  D:  08/04/2008  T:  08/04/2008  Job:  [id number]

## 2011-01-14 NOTE — Op Note (Signed)
NAME:  Brian Wang, Brian Wang               ACCOUNT NO.:  [id number]   MEDICAL RECORD NO.:  [id number]          PATIENT TYPE:  INP   LOCATION:  0011                         [facility]ACILITY:  [facility]   PHYSICIAN:  Lindaann Slough, M.D.  DATE OF BIRTH:  Apr 03, 1923   DATE OF PROCEDURE:  08/01/2008  DATE OF DISCHARGE:                               OPERATIVE REPORT   ATTENDING PHYSICIAN:  Danae Chen, M.D.   ASSISTANT:  Dr. Duane Boston.   PREOPERATIVE DIAGNOSIS:  Benign prostatic hypertrophy with urinary  retention.   POSTOPERATIVE DIAGNOSIS:  Benign prostatic hypertrophy with urinary  retention.   PROCEDURE:  Suprapubic prostatectomy.   INDICATIONS:  This is an 77 year old gentleman with a history of BPH and  urinary retention subsequent to this.  He underwent evaluation and due  to his episodes of retention and given his large prostate size he  elected to proceed with suprapubic prostatectomy.  Risks and benefits  were discussed with the patient preoperatively.   PROCEDURE IN DETAIL:  The patient was brought back to the operating room  and placed supine on the operating room table.  After the successful  induction of general endotracheal anesthetic a preoperative time-out was  performed.  He received preoperative culture specific antibiotics.   He was prepped and draped in the usual sterile fashion and a lower  midline incision was made from his pubic symphysis to just below his  umbilicus.  We dissected through subcutaneous skin to external oblique  fascia and down the midline of the rectus and pyramidalis muscles.  The  midline was further exposed.  The space of Retzius was developed  bluntly.   At this point two stay sutures were placed on the lateral aspects of the  midline of the bladder.  A vertical cystotomy incision was made and the  bladder was emptied.  At this point the Foley catheter that was placed  sterilely at the beginning of the case was removed and we identified  both  ureteral orifices.  We then used finger dissection with a finger  the prostatic urethra anteriorly to identify the capsule.  We continued  with finger dissection out laterally and posteriorly toward the apex.  Once the adenoma had been completely separated from the capsule it was  removed in its entirety.  With the aid of resector we examined the  prostatic capsule as well as the bladder neck.  There was no evidence of  injury to the prostatic capsule.  Two figure-of-eight sutures were  placed at the 5 and 7 o'clock position.  At this point bleeding had been  adequately controlled.  We reexamined both ureteral orifices and found  them to be effluxing clear urine.  At this point we placed a Malecot 24  French catheter as a suprapubic drain, placed a 24 three way Foley  catheter through his urethra as his urethral drain and closed the  bladder.  The bladder was closed in two layers.  We then checked the  bladder for leak and on filling it 180 mL of normal saline there was no  leak noted.  At this  point a Blake drain was placed.  All drains were  secured and the fascia was closed with PDS suture.  We reapproximated  the skin staples and at this point the procedure was ended.  Prior to  leaving the room we irrigated the catheters and placed him on CBI noting  light pink urine on a minimal CBI.  At this point the procedure was  ended.  Please note Dr. Brunilda Payor was the responsible surgeon and present  throughout the entirety of the case.   ESTIMATED BLOOD LOSS:  300 mL.   URINE OUTPUT:  Not recorded.   DRAINS:  1. 24 French SP tube.  2. Foley catheter.  3. Blake drain.   SPECIMENS:  Prostatic adenoma for final review.   DISPOSITION:  The patient will go to the PACU for further care.     ______________________________  Dr Bevelyn Ngo, M.D.  Electronically Signed    BP/MEDQ  D:  08/01/2008  T:  08/02/2008  Job:  [id number]

## 2011-01-14 NOTE — H&P (Signed)
NAME:  Brian Wang, Brian Wang               ACCOUNT NO.:  [id number]   MEDICAL RECORD NO.:  [id number]          PATIENT TYPE:  INP   LOCATION:  4703                         FACILITY:  [facility]   PHYSICIAN:  Sean A. Everardo All, MD    DATE OF BIRTH:  12-23-1922   DATE OF ADMISSION:  06/24/2007  DATE OF DISCHARGE:                              HISTORY & PHYSICAL   REASON FOR ADMISSION:  Hypotension.   HISTORY OF PRESENT ILLNESS:  An 77 year old man with several days of  nausea and vomiting.   Regarding his hypertension, he is taking his medications as prescribed.   He has been seen here in the office and then again in the emergency room  with a moderate headache at the bifrontal and bitemporal areas.  CT  scanning has been negative except for some sinusitis.   He was prescribed Ambien for insomnia in the emergency room yesterday  and he states this helped him much better than the trazodone he was  prescribed previously.   PAST MEDICAL HISTORY:  1. GERD.  2. Glaucoma.  3. Hypertension.  4. Type 2 diabetes.  5. PAD.  6. BPH.  7. Colonic polyps.  8. He recently had a negative adenosine Cardiolite.   MEDICATIONS:  1. K-Dur 20 mEq twice a day.  2. Nexium 40 mg daily.  3. Flomax 0.2 mg daily.  4. Lyrica 75 mg twice a day.  5. Glucophage XR 1000 mg a day.  6. Actos 30 mg a day.  7. Pletal 100 mg daily.  8. Finasteride 5 mg daily.  9. Folic acid 1 mg a day.  10.Plavix 75 mg a day.  11.Hyzaar 100/25 one daily.  12.Iron 325 mg daily.  13.Plendil 5 mg a day.  14.Lasix 40 mg a day.  15.He also takes eye drops, three different types.  These are      Alphagan, Travatan and Cosopt.  We are uncertain of these dosages      but the Travatan appears to be 1 drop in each eye every night.  The      Cosopt is 1 drop each eye twice daily.  The Alphagan is 1 drop in      the right eye daily at bedtime.   SOCIAL HISTORY:  He is married and lives with his wife.  His wife has  dementia.   FAMILY  HISTORY:  No one else at home is ill.   REVIEW OF SYSTEMS:  He has chronic anxiety and depression.  He denies  the following: fever, chest pain, syncope, shortness of breath,  diarrhea, rectal bleeding, hematuria, abdominal pains and skin rash.   PHYSICAL EXAMINATION:  VITAL SIGNS:  Blood pressure 89/49, heart rate  103, temperature 97.7.  GENERAL:  Elderly man in a wheelchair, intermittently vomiting.  SKIN:  No rash and not diaphoretic.  HEENT:  No proptosis.  No periorbital swelling.  Pharynx mucous  membranes are moist.  NECK:  Supple.  No goiter.  CHEST:  Clear to auscultation.  No respiratory distress.  CARDIOVASCULAR:  Trace bilateral pretibial edema.  Tachycardiac, regular  rhythm.  No murmur.  Pedal pulses are absent.  ABDOMEN:  Soft, obese, nontender.  No hepatosplenomegaly, no mass.  GENITAL/RECTAL:  Examination not done at this time due to the patient's  condition.  EXTREMITIES:  No deformities seen.  NEUROLOGIC:  Alert, well-oriented.  Cranial nerves appear to be intact  and he readily moves all fours, although he is examined in a wheelchair  only.   IMPRESSION:  1. Vomiting, uncertain etiology.  2. Hypotension, probably due to #1.  3. Headache of uncertain etiology.  4. Insomnia.  5. Other chronic medical problems as noted above.   PLAN:  1. Admit to telemetry.  2. Intravenous fluids.  3. Symptomatic therapy.  4. Hold blood pressure medications for now.  5. We discussed code status and the patient states he wants to defer      any decision for now.      Sean A. Everardo All, MD  Electronically Signed     SAE/MEDQ  D:  06/24/2007  T:  06/25/2007  Job:  [id number]

## 2011-01-17 NOTE — Op Note (Signed)
NAME:  Brian Wang, Brian Wang                         ACCOUNT NO.:  [id number]   MEDICAL RECORD NO.:  [id number]                   PATIENT TYPE:  AMB   LOCATION:  DAY                                  FACILITY:  [facility]   PHYSICIAN:  Abigail Miyamoto, M.D.              DATE OF BIRTH:  [birth year]   DATE OF PROCEDURE:  03/13/2003  DATE OF DISCHARGE:                                 OPERATIVE REPORT   PREOPERATIVE DIAGNOSIS:  Symptomatic cholelithiasis.   POSTOPERATIVE DIAGNOSIS:  Symptomatic cholelithiasis.   PROCEDURE:  Laparoscopic cholecystectomy with intraoperative cholangiogram.   SURGEON:  Abigail Miyamoto, M.D.   ASSISTANT:  Rose Phi. Maple Hudson, M.D.   ANESTHESIA:  General endotracheal anesthesia.   ESTIMATED BLOOD LOSS:  Minimal.   FINDINGS:  The patient was found to have a normal cholangiogram.   DESCRIPTION OF PROCEDURE:  The patient was brought to the operating room and  identified as Reynold Bowen. He was placed supine on the operating room  table and general anesthesia was induced. His abdomen was then prepped and  draped in the usual sterile fashion. Using a #15 blade, a small transverse  incision was made above the umbilicus. The incision was carried down to the  fascia which was then opened with the scalpel. The hemostat was then used to  pass into the peritoneal cavity. Next a #0 Vicryl pursestring suture was  placed around the fascial opening. The Hasson port was placed through the  opening and insufflation of the abdomen was begun. Next, a 12 mm port was  placed in the patient's epigastrium and two 5 mm ports were placed in the  patient's right flank under direct vision. The gallbladder was then  retracted above the liver bed. Dissection was then carried out at the base  of the gallbladder. The cystic duct was easily dissected out. A clip was  then placed distally on the duct. The duct was then partly opened with  laparoscopic scissors. Next an angiocatheter was then  inserted in the right  upper quadrant under direct vision. The cholangiocatheter was passed through  the angiocatheter and placed into the incision and the cystic duct. Next a  cholangiogram was performed under direct fluoroscopy with contrast. Good  flow of contrast was seen into the entire biliary system and duodenum  without evidence of abnormality or obstruction. At this point, the  cholangiocatheter was removed. The cystic duct was then clipped three times  proximally and completely transected with the scissors. The cystic artery  was then clipped twice proximally along the posterior branch and then  distally and transected with the scissors. The gallbladder was then easily  dissected free from the liver bed with the electrocautery. Once the  gallbladder was free from the liver bed, it was placed in an endosac and  removed through the incision at the umbilicus. The #0 Vicryl at the  umbilicus was tied in place closing the fascial  defect. The abdomen was then  copiously irrigated with normal saline. Again the liver bed was examined and  hemostasis was felt to be achieved. All ports were then removed under direct  vision and the abdomen was deflated. All incisions were then anesthetized  with 0.25% Marcaine and closed with 4-0 Monocryl subcuticular sutures. Steri-  Strips, gauze and  tape were then applied. The patient tolerated the procedure well. All  sponge, needle and instrument counts were correct at the end of the  procedure. The patient was then extubated in the operating room and taken in  stable condition to the recovery room.                                               Abigail Miyamoto, M.D.    DB/MEDQ  D:  03/13/2003  T:  03/13/2003  Job:  [id number]   cc:   Eino Farber., M.D.  [address] [place]  [place] [address]  Fax: [phone]

## 2011-01-17 NOTE — Discharge Summary (Signed)
NAME:  Brian Wang, Brian Wang                         ACCOUNT NO.:  [id number]   MEDICAL RECORD NO.:  [id number]                   PATIENT TYPE:  INP   LOCATION:  0373                                 FACILITY:  [facility]   PHYSICIAN:  Eino Farber., M.D.      DATE OF BIRTH:  1923/07/13   DATE OF ADMISSION:  01/29/2003  DATE OF DISCHARGE:  02/02/2003                                 DISCHARGE SUMMARY   DISCHARGE DIAGNOSES:  1. Acute pancreatitis, resolving.  2. Diabetes mellitus, type 2, poor control, improved.  3. Gastroparesis.  4. Hypertensive heart disease.  5. Hypercholesterolemia.  6. Cholelithiasis, symptomatic clinically.  7. Neuropathy of the extremities.  8. Prostate hyperplasia.  9. Obesity, marked.  10.      Hypotonic bowel with constipation.  11.      Degenerative joint disease with osteoarthritis of the lower     extremities.   BRIEF HISTORY:  The patient is a 77 year old African American male with a  known history of diabetes mellitus, being treated with Glucovance and Actos  with fair control, and was admitted because of acute onset of epigastric  pain that became worse over five to six days and was admitted from the  [facility] Emergency Department for further evaluation.  The laboratory data revealed the patient to have an elevated serum amylase,  consistent with acute pancreatitis.  The patient had experienced difficulty  with arthritis and bursitis of the left knee and had been placed on steroid  injection of the left knee and started on Bextra 20 mg p.o. daily before  these symptoms started.  The patient also complains of frequent constipation  in which he has to take magnesium citrate as a laxative.   PAST HISTORY:  Past history revealed the patient to have a prostate  enlargement and is on Proscar.  He also has hypertensive heart disease and  takes Hyzaar 100 mg with 25 mg of hydrochlorothiazide.  He also takes  Lipitor to help  control his hypercholesterolemia.   SOCIAL HISTORY:  The patient denies use of alcoholic beverage and also  denies smoking.   FAMILY HISTORY:  The family history was noncontributory.   ALLERGIES:  The patient related a GI intolerance to aspirin.   PERTINENT PHYSICAL FINDINGS:  GENERAL:  Pertinent physical findings on Jan 29, 2003 revealed a patient that was a moderate to markedly obese African  American male that was oriented to time, place and person.  VITAL SIGNS:  Vital signs revealed a temperature of 97.8, pulse 64,  respirations 16, and a blood pressure of 131/70.  HEENT:  Examination of the head, eyes, ears, nose and throat revealed the  eyes to have the pupils to be equal, round and reactive to light and  accommodation.  LUNGS:  The lungs were clear to auscultation and percussion.  HEART:  The heart had a regular rhythm with no rubs.  ABDOMEN:  The abdomen revealed  moderate to marked obesity with minimal  epigastric tenderness and no masses or organomegaly detected.  RECTAL:  The rectum revealed no masses and the prostate was moderately  enlarged.  EXTREMITIES:  The extremities revealed the knees to have mild degenerative  joint disease changes.   LABORATORY DATA:  Serum amylase on admission revealed the amylase to be  increased to 211 with a serum lipase of 128, with the upper limit being 51  units per liter.   HOSPITAL COURSE:  The patient's hospital course was one of gradual  improvement, but did require medications for constipation, including stool  softeners of Senokot and Dulcolax tablets.  He was placed on  Reglan also to  help with his gastroparesis and gastric emptying.  The patient had changes  of suspected gallbladder disease on his CT scan as well as on the  ultrasound.  He felt that his gallstones were not that symptomatic that he  wanted to pursue surgery at the time of his admission and he was discharged  for followup with a surgeon of his choice.  The  patient had been followed by  Brian Wang, M.D., endocrinologist, and he will call his office for  followup in my office, Brian Wang, M.D., if needed.   DISCHARGE MEDICATIONS:  1. Plendil 5 mg tablets one daily.  2. Hyzaar 100/25 mg one daily.  3. Glucovance 5/500 mg tablets - one twice a day with breakfast and dinner.  4. Actos 15 mg tabs p.o. with dinner.  5. Pletal 100 mg tabs - one p.o. daily.  6. Proscar 5 mg one p.o. daily.  7. Amitriptyline 10 mg tabs or usual dose - one every night.  8. Reglan 10 mg tabs a.c. and h.s.  9. Senokot caps two twice a day if needed for stool softener.  10.      Dulcolax tablets two at night as needed for constipation.  11.      Vicodin one 5/500 mg tab q.6h. p.r.n. for pain.  12.      He was to continue his eye drops as needed for his eye condition.  13.      He was also to continue on his Lipitor 10 mg or as needed and     adjusted for control of his cholesterol.   DIET:  He was to continue on a 1600-calorie Anti-Diabetic Association Diet  with no added salt and low-fat/low cholesterol.   DISPOSITION:  The patient is to call office of Brian Wang, M.D. for  further followup as desired and my office, Brian Alexanders., M.D., if  needed for further evaluation and direction for his gallstones.  He will  follow up with Brian Wang office for his degenerative joint  disease, especially of the knees.                                                Eino Farber., M.D.    GRK/MEDQ  D:  04/27/2003  T:  04/28/2003  Job:  [id number]

## 2011-02-25 ENCOUNTER — Other Ambulatory Visit: Payer: Self-pay | Admitting: *Deleted

## 2011-02-25 MED ORDER — SAXAGLIPTIN HCL 5 MG PO TABS: ORAL_TABLET | ORAL | Status: DC

## 2011-02-25 NOTE — Telephone Encounter (Signed)
Pt left on vm requsting Onglyza to be sent to [facility].

## 2011-02-25 NOTE — Telephone Encounter (Signed)
Notified pt med to [facility]..Marland Kitchen6/26/12@3 :32pm/LMB

## 2011-02-27 ENCOUNTER — Encounter: Payer: Self-pay | Admitting: *Deleted

## 2011-02-27 NOTE — Telephone Encounter (Signed)
Pt left message requesting to switch from Levemir vials to pens. Left message for pt to callback office for clarification

## 2011-02-27 NOTE — Telephone Encounter (Signed)
A user error has taken place: encounter opened in error, closed for administrative reasons.

## 2011-03-04 ENCOUNTER — Other Ambulatory Visit: Payer: Self-pay

## 2011-03-04 MED ORDER — TRAMADOL HCL 50 MG PO TABS: 50.0000 mg | ORAL_TABLET | ORAL | Status: DC | PRN

## 2011-03-04 NOTE — Telephone Encounter (Signed)
Please refill prn 

## 2011-03-04 NOTE — Telephone Encounter (Signed)
Pt is requesting 90 day supply to [facility] for Tramadol

## 2011-03-21 ENCOUNTER — Other Ambulatory Visit: Payer: Self-pay | Admitting: Otolaryngology

## 2011-04-01 ENCOUNTER — Other Ambulatory Visit: Payer: Self-pay | Admitting: *Deleted

## 2011-04-01 MED ORDER — PRASUGREL HCL 10 MG PO TABS: 10.0000 mg | ORAL_TABLET | Freq: Every day | ORAL | Status: DC

## 2011-04-01 MED ORDER — SAXAGLIPTIN HCL 5 MG PO TABS: ORAL_TABLET | ORAL | Status: DC

## 2011-04-01 NOTE — Telephone Encounter (Signed)
Pt called requesting refills of Effient and Onglyza to [facility]

## 2011-06-03 LAB — URINALYSIS, ROUTINE W REFLEX MICROSCOPIC
Bilirubin Urine: NEGATIVE
Ketones, ur: NEGATIVE mg/dL
Nitrite: NEGATIVE
Protein, ur: NEGATIVE mg/dL
Specific Gravity, Urine: 1.007 (ref 1.005–1.030)
Urobilinogen, UA: 0.2 mg/dL (ref 0.0–1.0)

## 2011-06-03 LAB — CBC
HCT: 36.1 % — ABNORMAL LOW (ref 39.0–52.0)
MCV: 87.4 fL (ref 78.0–100.0)
Platelets: 426 10*3/uL — ABNORMAL HIGH (ref 150–400)
RDW: 14.6 % (ref 11.5–15.5)
WBC: 6.6 10*3/uL (ref 4.0–10.5)

## 2011-06-03 LAB — COMPREHENSIVE METABOLIC PANEL
Albumin: 3.6 g/dL (ref 3.5–5.2)
BUN: 21 mg/dL (ref 6–23)
Chloride: 104 mEq/L (ref 96–112)
Creatinine, Ser: 1.47 mg/dL (ref 0.4–1.5)
Total Bilirubin: 0.7 mg/dL (ref 0.3–1.2)

## 2011-06-03 LAB — URINE MICROSCOPIC-ADD ON

## 2011-06-03 LAB — PROTIME-INR
INR: 1 (ref 0.00–1.49)
Prothrombin Time: 13.3 seconds (ref 11.6–15.2)

## 2011-06-03 LAB — URINE CULTURE: Colony Count: >=100000

## 2011-06-03 LAB — APTT: aPTT: 33 seconds (ref 24–37)

## 2011-06-05 LAB — DIFFERENTIAL
Lymphocytes Relative: 18 % (ref 12–46)
Lymphs Abs: 1.6 10*3/uL (ref 0.7–4.0)
Monocytes Relative: 8 % (ref 3–12)
Neutro Abs: 6.4 10*3/uL (ref 1.7–7.7)
Neutrophils Relative %: 72 % (ref 43–77)

## 2011-06-05 LAB — GLUCOSE, CAPILLARY
Glucose-Capillary: 104 mg/dL — ABNORMAL HIGH (ref 70–99)
Glucose-Capillary: 107 mg/dL — ABNORMAL HIGH (ref 70–99)
Glucose-Capillary: 114 mg/dL — ABNORMAL HIGH (ref 70–99)
Glucose-Capillary: 116 mg/dL — ABNORMAL HIGH (ref 70–99)
Glucose-Capillary: 116 mg/dL — ABNORMAL HIGH (ref 70–99)
Glucose-Capillary: 117 mg/dL — ABNORMAL HIGH (ref 70–99)
Glucose-Capillary: 124 mg/dL — ABNORMAL HIGH (ref 70–99)
Glucose-Capillary: 139 mg/dL — ABNORMAL HIGH (ref 70–99)
Glucose-Capillary: 141 mg/dL — ABNORMAL HIGH (ref 70–99)
Glucose-Capillary: 144 mg/dL — ABNORMAL HIGH (ref 70–99)
Glucose-Capillary: 163 mg/dL — ABNORMAL HIGH (ref 70–99)

## 2011-06-05 LAB — CBC
HCT: 29.1 % — ABNORMAL LOW (ref 39.0–52.0)
Hemoglobin: 8.8 g/dL — ABNORMAL LOW (ref 13.0–17.0)
MCV: 87.6 fL (ref 78.0–100.0)
Platelets: 313 10*3/uL (ref 150–400)
RBC: 2.91 MIL/uL — ABNORMAL LOW (ref 4.22–5.81)
RBC: 3.05 MIL/uL — ABNORMAL LOW (ref 4.22–5.81)
WBC: 7.5 10*3/uL (ref 4.0–10.5)
WBC: 8.9 10*3/uL (ref 4.0–10.5)

## 2011-06-05 LAB — TYPE AND SCREEN
ABO/RH(D): AB POS
Antibody Screen: NEGATIVE

## 2011-06-05 LAB — BASIC METABOLIC PANEL
BUN: 21 mg/dL (ref 6–23)
Calcium: 7.5 mg/dL — ABNORMAL LOW (ref 8.4–10.5)
Chloride: 103 mEq/L (ref 96–112)
Chloride: 108 mEq/L (ref 96–112)
Creatinine, Ser: 1.48 mg/dL (ref 0.4–1.5)
GFR calc Af Amer: 55 mL/min — ABNORMAL LOW (ref >60)
GFR calc non Af Amer: 45 mL/min — ABNORMAL LOW (ref >60)
Potassium: 3.6 mEq/L (ref 3.5–5.1)

## 2011-06-05 LAB — ABO/RH: ABO/RH(D): AB POS

## 2011-06-11 LAB — BASIC METABOLIC PANEL
BUN: 5 — ABNORMAL LOW
Calcium: 8.6
Calcium: 8.7
Creatinine, Ser: 1.49
GFR calc Af Amer: 55 — ABNORMAL LOW
GFR calc Af Amer: >60
GFR calc non Af Amer: 50 — ABNORMAL LOW
GFR calc non Af Amer: 57 — ABNORMAL LOW
Glucose, Bld: 116 — ABNORMAL HIGH
Potassium: 4.9
Sodium: 121 — ABNORMAL LOW
Sodium: 134 — ABNORMAL LOW
Sodium: 136

## 2011-06-11 LAB — COMPREHENSIVE METABOLIC PANEL
Albumin: 2.7 — ABNORMAL LOW
Albumin: 3 — ABNORMAL LOW
Alkaline Phosphatase: 75
BUN: 10
BUN: 15
CO2: 25
Calcium: 8.8
Chloride: 86 — ABNORMAL LOW
Chloride: 96
Creatinine, Ser: 1.73 — ABNORMAL HIGH
GFR calc non Af Amer: 38 — ABNORMAL LOW
Potassium: 5.4 — ABNORMAL HIGH
Total Bilirubin: 0.9
Total Bilirubin: 1

## 2011-06-11 LAB — CBC
HCT: 31.3 — ABNORMAL LOW
HCT: 36.3 — ABNORMAL LOW
Hemoglobin: 10.4 — ABNORMAL LOW
Hemoglobin: 11.7 — ABNORMAL LOW
MCHC: 34.2
MCV: 83.2
Platelets: 534 — ABNORMAL HIGH
Platelets: 550 — ABNORMAL HIGH
Platelets: 576 — ABNORMAL HIGH
RBC: 3.71 — ABNORMAL LOW
RDW: 15.9 — ABNORMAL HIGH
WBC: 11 — ABNORMAL HIGH
WBC: 27.1 — ABNORMAL HIGH

## 2011-06-11 LAB — AMYLASE: Amylase: 80

## 2011-06-11 LAB — LIPASE, BLOOD: Lipase: 41

## 2011-06-11 LAB — TSH: TSH: 1.476

## 2011-06-11 LAB — OSMOLALITY, URINE: Osmolality, Ur: 120 — ABNORMAL LOW

## 2011-06-11 LAB — URINALYSIS, ROUTINE W REFLEX MICROSCOPIC
Bilirubin Urine: NEGATIVE
Ketones, ur: NEGATIVE
Protein, ur: NEGATIVE
Urobilinogen, UA: 0.2

## 2011-06-18 LAB — I-STAT 8, (EC8 V) (CONVERTED LAB)
Acid-base deficit: 2
Bicarbonate: 21.9
HCT: 39
Operator id: 272551
pCO2, Ven: 33 — ABNORMAL LOW

## 2011-06-18 LAB — POCT CARDIAC MARKERS: Troponin i, poc: <0.05

## 2011-06-18 LAB — POCT I-STAT CREATININE
Creatinine, Ser: 1.7 — ABNORMAL HIGH
Operator id: 272551

## 2011-06-18 LAB — D-DIMER, QUANTITATIVE: D-Dimer, Quant: 0.44

## 2011-07-14 ENCOUNTER — Encounter: Payer: Medicare Other | Admitting: Endocrinology

## 2011-09-08 ENCOUNTER — Other Ambulatory Visit: Payer: Self-pay | Admitting: *Deleted

## 2011-09-08 MED ORDER — PRASUGREL HCL 10 MG PO TABS: 10.0000 mg | ORAL_TABLET | Freq: Every day | ORAL | Status: DC

## 2011-09-08 NOTE — Telephone Encounter (Signed)
Pt called requesting refill of Effient (14 day supply) to [facility]. Rx sent, pt informed.

## 2011-09-16 ENCOUNTER — Ambulatory Visit: Payer: Medicare Other | Admitting: Endocrinology

## 2011-09-23 ENCOUNTER — Encounter: Payer: Self-pay | Admitting: Endocrinology

## 2011-09-23 ENCOUNTER — Other Ambulatory Visit (INDEPENDENT_AMBULATORY_CARE_PROVIDER_SITE_OTHER): Payer: Medicare Other

## 2011-09-23 ENCOUNTER — Ambulatory Visit (INDEPENDENT_AMBULATORY_CARE_PROVIDER_SITE_OTHER): Payer: Medicare Other | Admitting: Endocrinology

## 2011-09-23 DIAGNOSIS — Z125 Encounter for screening for malignant neoplasm of prostate: Secondary | ICD-10-CM

## 2011-09-23 DIAGNOSIS — I10 Essential (primary) hypertension: Secondary | ICD-10-CM

## 2011-09-23 DIAGNOSIS — Z79899 Other long term (current) drug therapy: Secondary | ICD-10-CM

## 2011-09-23 DIAGNOSIS — E119 Type 2 diabetes mellitus without complications: Secondary | ICD-10-CM

## 2011-09-23 DIAGNOSIS — E871 Hypo-osmolality and hyponatremia: Secondary | ICD-10-CM

## 2011-09-23 DIAGNOSIS — N189 Chronic kidney disease, unspecified: Secondary | ICD-10-CM

## 2011-09-23 DIAGNOSIS — E785 Hyperlipidemia, unspecified: Secondary | ICD-10-CM

## 2011-09-23 DIAGNOSIS — M25569 Pain in unspecified knee: Secondary | ICD-10-CM

## 2011-09-23 DIAGNOSIS — D509 Iron deficiency anemia, unspecified: Secondary | ICD-10-CM

## 2011-09-23 DIAGNOSIS — F3289 Other specified depressive episodes: Secondary | ICD-10-CM | POA: Insufficient documentation

## 2011-09-23 DIAGNOSIS — F329 Major depressive disorder, single episode, unspecified: Secondary | ICD-10-CM | POA: Insufficient documentation

## 2011-09-23 LAB — CBC WITH DIFFERENTIAL/PLATELET
Basophils Relative: 0.5 % (ref 0.0–3.0)
Eosinophils Absolute: 0.1 10*3/uL (ref 0.0–0.7)
HCT: 38 % — ABNORMAL LOW (ref 39.0–52.0)
Lymphs Abs: 2.3 10*3/uL (ref 0.7–4.0)
MCHC: 33.3 g/dL (ref 30.0–36.0)
MCV: 83.6 fl (ref 78.0–100.0)
Monocytes Absolute: 0.4 10*3/uL (ref 0.1–1.0)
Neutrophils Relative %: 55.3 % (ref 43.0–77.0)
Platelets: 318 10*3/uL (ref 150.0–400.0)
RBC: 4.55 Mil/uL (ref 4.22–5.81)

## 2011-09-23 LAB — URINALYSIS, ROUTINE W REFLEX MICROSCOPIC
Bilirubin Urine: NEGATIVE
Ketones, ur: NEGATIVE
Leukocytes, UA: NEGATIVE
Specific Gravity, Urine: 1.025 (ref 1.000–1.030)
Total Protein, Urine: 30
Urobilinogen, UA: 0.2 (ref 0.0–1.0)
pH: 5 (ref 5.0–8.0)

## 2011-09-23 NOTE — Patient Instructions (Addendum)
Please come back for a regular physical appointment in 3 months i agree with getting hearing aids.  blood tests are being requested for you today.  please call [phone] to hear your test results.  You will be prompted to enter the 9-digit "MRN" number that appears at the top left of this page, followed by #.  Then you will hear the message. Refer to an orthopedic specialist.  you will receive a phone call, about a day and time for an appointment. Refer for home health.   (update: pt is called, and started on insulin.  Also: Chol is high. Make sure you are on crestor. Increase iron to 1 pill bid.)

## 2011-09-23 NOTE — Progress Notes (Signed)
Subjective:    Patient ID: Brian Wang, male    DOB: 10/31/1922, 78 y.o.   MRN: [id number]  HPI Pt returns for f/u of type 2 DM (1997).  no cbg record, but states cbg's are well-controlled. He takes fe 1/day. Pt states 1 year of slight hearing loss, from both ears, but no assoc ear pain.  He has seen ent Pt reports few year of moderate pain at right knee, not in the context of any injury.  He saw ortho a few years ago.   He sees dr Donell Beers for depression.  He wants hh eval Past Medical History  Diagnosis Date  . DIABETES MELLITUS, TYPE II 05/13/2007  . CHEST WALL PAIN, ACUTE 01/10/2008  . RENAL INSUFFICIENCY, CHRONIC 05/29/2009  . HYPERTENSION 05/13/2007  . ANEMIA, IRON DEFICIENCY 10/25/2008  . INSOMNIA 07/22/2007  . Hyposmolality and/or hyponatremia 07/22/2007  . COLONIC POLYPS, HX OF 05/13/2007  . BENIGN PROSTATIC HYPERTROPHY 05/13/2007  . GERD 05/13/2007  . DYSLIPIDEMIA 05/30/2010  . PERIPHERAL VASCULAR DISEASE 05/13/2007    Past Surgical History  Procedure Date  . Suprapubic prostatectomy 08/01/2008    Su Grand MD  . Cholecystectomy 03/13/2003    Abigail Miyamoto MD    History   Social History  . Marital Status: Married    Spouse Name: N/A    Number of Children: N/A  . Years of Education: N/A   Occupational History  . 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 Smoking status: Former Games developer  . Smokeless tobacco: Not on file  . Alcohol Use: No  . Drug Use: No  . Sexually Active:    Other Topics Concern  . Not on file   Social History Narrative  . No narrative on file    Current Outpatient Prescriptions on File Prior to Visit  Medication Sig Dispense Refill  . ALPRAZolam (XANAX) 0.5 MG tablet Take 0.5 mg by mouth at bedtime as needed.        . brimonidine (ALPHAGAN P) 0.1 % SOLN 1 drop in right eye daily       . bromocriptine (PARLODEL) 2.5 MG tablet 1/2 tablet at bedtime       . cilostazol (PLETAL) 100 MG tablet TAKE 1 TABLET DAILY  90  tablet  2  . dorzolamide-timolol (COSOPT) 22.3-6.8 MG/ML ophthalmic solution Place 1 drop into both eyes 2 (two) times daily.        . Ferrous Sulfate (IRON) 325 (65 FE) MG TABS Take 1 tablet by mouth daily.        . furosemide (LASIX) 40 MG tablet TAKE 1 TABLET DAILY  90 tablet  2  . hydrOXYzine (ATARAX) 50 MG tablet Take 50 mg by mouth at bedtime as needed.        Marland Kitchen LORazepam (ATIVAN) 1 MG tablet 2 tablets by mouth at bedtime       . losartan (COZAAR) 100 MG tablet Take 1 tablet (100 mg total) by mouth daily.  90 tablet  3  . Multiple Vitamin (MULTIVITAMIN) tablet Take 1 tablet by mouth daily.        Marland Kitchen omeprazole (PRILOSEC) 20 MG capsule TAKE 2 CAPSULES ONCE DAILY  180 capsule  2  . prasugrel (EFFIENT) 10 MG TABS Take 1 tablet (10 mg total) by mouth daily.  14 tablet  0  . rosuvastatin (CRESTOR) 40 MG tablet Take 40 mg by mouth daily.        . saxagliptin HCl (  ONGLYZA) 5 MG TABS tablet 1/2 tablet once daily  45 tablet  1  . traMADol (ULTRAM) 50 MG tablet Take 1 tablet (50 mg total) by mouth every 4 (four) hours as needed. For pain  540 tablet  1  . travoprost, benzalkonium, (TRAVATAN) 0.004 % ophthalmic solution Place 1 drop into both eyes at bedtime.          Allergies  Allergen Reactions  . Pioglitazone     REACTION: edema    Family History  Problem Relation Age of Onset  . Heart disease Mother     BP 132/70  Pulse 88  Temp(Src) 97 F (36.1 C) (Oral)  Ht 5\' 8"  (1.727 m)  Wt 187 lb 6 oz (84.993 kg)  BMI 28.49 kg/m2  SpO2 97%    Review of Systems Denies hematuria and brbpr.  He has lost a few lbs.      Objective:   Physical Exam VITAL SIGNS:  See vs page GENERAL: no distress Right knee: normal except for slight swelling Pulses: dorsalis pedis intact bilat.   Feet: no deformity.  no ulcer on the feet.  feet are of normal color and temp.  no edema Neuro: sensation is intact to touch on the feet.   Lab Results  Component Value Date   WBC 6.3 09/23/2011   HGB 12.7*  09/23/2011   HCT 38.0* 09/23/2011   PLT 318.0 09/23/2011   GLUCOSE 263* 09/23/2011   CHOL 199 09/23/2011   TRIG 123.0 09/23/2011   HDL 45.80 09/23/2011   LDLDIRECT 232.9 05/30/2010   LDLCALC 129* 09/23/2011   ALT 16 09/23/2011   AST 18 09/23/2011   NA 137 09/23/2011   K 3.7 09/23/2011   CL 101 09/23/2011   CREATININE 1.5 09/23/2011   BUN 16 09/23/2011   CO2 26 09/23/2011   TSH 1.84 09/23/2011   PSA 2.18 09/23/2011   INR 1.0 07/26/2008   HGBA1C 9.7* 09/23/2011   MICROALBUR 11.5* 09/23/2011      Assessment & Plan:  Hearing loss, unchanged DM: needs increased rx Right knee pain, recurrent Dyslipidemia, needs increased rx Anemia, needs increased rx

## 2011-09-24 LAB — HEPATIC FUNCTION PANEL
ALT: 16 U/L (ref 0–53)
AST: 18 U/L (ref 0–37)
Alkaline Phosphatase: 71 U/L (ref 39–117)
Bilirubin, Direct: 0.1 mg/dL (ref 0.0–0.3)
Total Bilirubin: 0.8 mg/dL (ref 0.3–1.2)

## 2011-09-24 LAB — MICROALBUMIN / CREATININE URINE RATIO
Creatinine,U: 153.6 mg/dL
Microalb Creat Ratio: 7.5 mg/g (ref 0.0–30.0)
Microalb, Ur: 11.5 mg/dL — ABNORMAL HIGH (ref 0.0–1.9)

## 2011-09-24 LAB — IBC PANEL: Transferrin: 198.6 mg/dL — ABNORMAL LOW (ref 212.0–360.0)

## 2011-09-24 LAB — LIPID PANEL
Cholesterol: 199 mg/dL (ref 0–200)
VLDL: 24.6 mg/dL (ref 0.0–40.0)

## 2011-09-24 LAB — BASIC METABOLIC PANEL
Calcium: 9.2 mg/dL (ref 8.4–10.5)
GFR: 57.46 mL/min — ABNORMAL LOW (ref >60.00)
Potassium: 3.7 mEq/L (ref 3.5–5.1)
Sodium: 137 mEq/L (ref 135–145)

## 2011-09-24 LAB — URIC ACID: Uric Acid, Serum: 4.9 mg/dL (ref 4.0–7.8)

## 2011-09-24 LAB — PTH, INTACT AND CALCIUM: Calcium, Total (PTH): 9.1 mg/dL (ref 8.4–10.5)

## 2011-09-24 LAB — TSH: TSH: 1.84 u[IU]/mL (ref 0.35–5.50)

## 2011-10-15 ENCOUNTER — Telehealth: Payer: Self-pay | Admitting: *Deleted

## 2011-10-15 DIAGNOSIS — M25569 Pain in unspecified knee: Secondary | ICD-10-CM

## 2011-10-15 NOTE — Telephone Encounter (Signed)
Pt informed

## 2011-10-15 NOTE — Telephone Encounter (Signed)
Pt called requesting orders for home health visits and PT.

## 2011-10-15 NOTE — Telephone Encounter (Signed)
done

## 2011-10-30 ENCOUNTER — Other Ambulatory Visit: Payer: Self-pay | Admitting: *Deleted

## 2011-10-30 MED ORDER — CILOSTAZOL 100 MG PO TABS: ORAL_TABLET | ORAL | Status: DC

## 2011-10-30 MED ORDER — ROSUVASTATIN CALCIUM 40 MG PO TABS: 40.0000 mg | ORAL_TABLET | Freq: Every day | ORAL | Status: DC

## 2011-10-30 MED ORDER — OMEPRAZOLE 20 MG PO CPDR: DELAYED_RELEASE_CAPSULE | ORAL | Status: DC

## 2011-10-30 MED ORDER — SAXAGLIPTIN HCL 5 MG PO TABS: ORAL_TABLET | ORAL | Status: DC

## 2011-10-30 NOTE — Telephone Encounter (Signed)
Pt needs new rx for Crestor, Onglyza, Pletal and Omeprazole sent to [facility]. Pt informed rx sent to Pharmacy.

## 2011-11-26 ENCOUNTER — Other Ambulatory Visit: Payer: Self-pay | Admitting: *Deleted

## 2011-11-26 MED ORDER — PRASUGREL HCL 10 MG PO TABS: 10.0000 mg | ORAL_TABLET | Freq: Every day | ORAL | Status: DC

## 2011-11-26 NOTE — Telephone Encounter (Signed)
R'cd fax from [facility] pharmacy for refil of Effient

## 2011-12-08 ENCOUNTER — Other Ambulatory Visit: Payer: Self-pay | Admitting: *Deleted

## 2011-12-08 MED ORDER — ROSUVASTATIN CALCIUM 40 MG PO TABS: 40.0000 mg | ORAL_TABLET | Freq: Every day | ORAL | Status: DC

## 2011-12-08 MED ORDER — FUROSEMIDE 40 MG PO TABS: ORAL_TABLET | ORAL | Status: DC

## 2011-12-08 MED ORDER — PRASUGREL HCL 10 MG PO TABS: 10.0000 mg | ORAL_TABLET | Freq: Every day | ORAL | Status: DC

## 2011-12-08 MED ORDER — SAXAGLIPTIN HCL 5 MG PO TABS: ORAL_TABLET | ORAL | Status: DC

## 2011-12-08 MED ORDER — BROMOCRIPTINE MESYLATE 2.5 MG PO TABS: ORAL_TABLET | ORAL | Status: DC

## 2011-12-08 MED ORDER — LOSARTAN POTASSIUM 100 MG PO TABS: 100.0000 mg | ORAL_TABLET | Freq: Every day | ORAL | Status: DC

## 2011-12-08 MED ORDER — CILOSTAZOL 100 MG PO TABS: ORAL_TABLET | ORAL | Status: DC

## 2011-12-08 MED ORDER — TRAMADOL HCL 50 MG PO TABS: 50.0000 mg | ORAL_TABLET | ORAL | Status: DC | PRN

## 2011-12-08 MED ORDER — OMEPRAZOLE 20 MG PO CPDR: DELAYED_RELEASE_CAPSULE | ORAL | Status: DC

## 2011-12-08 NOTE — Telephone Encounter (Signed)
Rx printed out for all rx's to take to [facility] for 1 year. Okay per Dr. Everardo All, pt given rx at spouse's OV.

## 2012-01-22 ENCOUNTER — Other Ambulatory Visit: Payer: Self-pay

## 2012-01-22 NOTE — Telephone Encounter (Signed)
A user error has taken place: encounter opened in error, closed for administrative reasons.

## 2012-01-27 ENCOUNTER — Other Ambulatory Visit: Payer: Self-pay | Admitting: *Deleted

## 2012-01-27 MED ORDER — FUROSEMIDE 40 MG PO TABS: ORAL_TABLET | ORAL | Status: DC

## 2012-01-27 NOTE — Telephone Encounter (Signed)
R'cd fax from [facility] for refill of Furosemide.

## 2012-03-11 ENCOUNTER — Ambulatory Visit: Payer: Medicare Other | Admitting: Endocrinology

## 2012-06-04 ENCOUNTER — Other Ambulatory Visit: Payer: Self-pay | Admitting: General Practice

## 2012-06-04 MED ORDER — OMEPRAZOLE 20 MG PO CPDR: DELAYED_RELEASE_CAPSULE | ORAL | Status: DC

## 2012-06-08 ENCOUNTER — Other Ambulatory Visit: Payer: Self-pay | Admitting: General Practice

## 2012-06-08 MED ORDER — CILOSTAZOL 100 MG PO TABS: ORAL_TABLET | ORAL | Status: DC

## 2012-06-11 ENCOUNTER — Other Ambulatory Visit: Payer: Self-pay | Admitting: Endocrinology

## 2012-06-11 MED ORDER — OMEPRAZOLE 20 MG PO CPDR: DELAYED_RELEASE_CAPSULE | ORAL | Status: DC

## 2012-06-11 NOTE — Telephone Encounter (Signed)
rx sent to [facility] 

## 2012-06-11 NOTE — Telephone Encounter (Signed)
Pt would like refills of Prilosec and Pletal. Pharmacy states that they have sent several requests and have heard nothing back from [place]. Please fill at [facility] and advise patient.

## 2012-06-17 ENCOUNTER — Other Ambulatory Visit: Payer: Self-pay | Admitting: General Practice

## 2012-06-17 ENCOUNTER — Other Ambulatory Visit: Payer: Self-pay | Admitting: Endocrinology

## 2012-06-17 MED ORDER — OMEPRAZOLE 20 MG PO CPDR: DELAYED_RELEASE_CAPSULE | ORAL | Status: DC

## 2012-06-17 NOTE — Telephone Encounter (Signed)
Medication sent to [facility] on 10/17 at 11:30am

## 2012-06-17 NOTE — Telephone Encounter (Signed)
Pt's Prilosec was filled at [facility]. Pt needs it switched to [facility]. Their phone # is [phone]. His reference # for that med is [id number]. Please advise pt once this is completed. Pt would also like [facility] removed from his chart.

## 2012-07-12 ENCOUNTER — Other Ambulatory Visit: Payer: Self-pay

## 2012-07-12 MED ORDER — PRASUGREL HCL 10 MG PO TABS: 10.0000 mg | ORAL_TABLET | Freq: Every day | ORAL | Status: DC

## 2012-07-12 MED ORDER — LOSARTAN POTASSIUM 100 MG PO TABS: 100.0000 mg | ORAL_TABLET | Freq: Every day | ORAL | Status: DC

## 2012-07-12 NOTE — Telephone Encounter (Deleted)
Pt request rx refill for effient 10mg  and losartan 100mg   [facility]

## 2012-07-15 ENCOUNTER — Telehealth: Payer: Self-pay | Admitting: Endocrinology

## 2012-07-15 NOTE — Telephone Encounter (Signed)
Patient called stating that he is completely out of his effient 10 mg tabs 1poqd. and will not receive the mail order rx for a week or so and would like to have some called into [facility] [address]. Please assist.

## 2012-08-02 ENCOUNTER — Telehealth: Payer: Self-pay | Admitting: Endocrinology

## 2012-08-02 NOTE — Telephone Encounter (Signed)
Ov tomorrow 

## 2012-08-04 ENCOUNTER — Telehealth: Payer: Self-pay | Admitting: Endocrinology

## 2012-08-04 NOTE — Telephone Encounter (Signed)
Patient stated he will call back and schedule an appointment at a later date.

## 2012-08-04 NOTE — Telephone Encounter (Signed)
Patient left a message stating that his spouse died a couple weeks ago and he would like to know if he can have something called into his pharmacy [facility] to help him sleep. Patient emphasizes that he does not feel like coming in for a visit at this time and as soon as he feels up to it he will call and schedule an appt. Please advise/assist.

## 2012-08-04 NOTE — Telephone Encounter (Signed)
Message already answered

## 2012-08-16 ENCOUNTER — Ambulatory Visit: Payer: Medicare Other | Admitting: Endocrinology

## 2012-08-23 ENCOUNTER — Ambulatory Visit (INDEPENDENT_AMBULATORY_CARE_PROVIDER_SITE_OTHER): Payer: Medicare Other | Admitting: Internal Medicine

## 2012-08-23 ENCOUNTER — Encounter: Payer: Self-pay | Admitting: Internal Medicine

## 2012-08-23 VITALS — BP 146/82 | HR 84 | Temp 98.2°F | Ht 68.0 in | Wt 181.5 lb

## 2012-08-23 DIAGNOSIS — F329 Major depressive disorder, single episode, unspecified: Secondary | ICD-10-CM

## 2012-08-23 MED ORDER — FLUOXETINE HCL (PMDD) 10 MG PO CAPS: 10.0000 mg | ORAL_CAPSULE | Freq: Every morning | ORAL | Status: DC

## 2012-08-23 NOTE — Progress Notes (Signed)
Subjective:    Patient ID: Brian Wang, male    DOB: [birth year], 78 y.o.   MRN: [id number]  HPI  Pt presents to the clinic with c/o depression. His wife of  63 years just passed away August 06, 2012. Since that time, he has had difficulty sleeping, lack of appetite and fatigue. He has not been taking any ativan or xanax for these symptoms. He states that he cries all the time. He has never has any type of depression before.  Review of Systems  Past Medical History  Diagnosis Date  . DIABETES MELLITUS, TYPE II 05/13/2007  . CHEST WALL PAIN, ACUTE 01/10/2008  . RENAL INSUFFICIENCY, CHRONIC 05/29/2009  . HYPERTENSION 05/13/2007  . ANEMIA, IRON DEFICIENCY 10/25/2008  . INSOMNIA 07/22/2007  . Hyposmolality and/or hyponatremia 07/22/2007  . COLONIC POLYPS, HX OF 05/13/2007  . BENIGN PROSTATIC HYPERTROPHY 05/13/2007  . GERD 05/13/2007  . DYSLIPIDEMIA 05/30/2010  . PERIPHERAL VASCULAR DISEASE 05/13/2007    Current Outpatient Prescriptions  Medication Sig Dispense Refill  . ALPRAZolam (XANAX) 0.5 MG tablet Take 0.5 mg by mouth at bedtime as needed.        . brimonidine (ALPHAGAN P) 0.1 % SOLN 1 drop in right eye daily       . bromocriptine (PARLODEL) 2.5 MG tablet 1/2 tablet at bedtime  45 tablet  3  . cilostazol (PLETAL) 100 MG tablet TAKE 1 TABLET DAILY  90 tablet  3  . dorzolamide-timolol (COSOPT) 22.3-6.8 MG/ML ophthalmic solution Place 1 drop into both eyes 2 (two) times daily.        . Ferrous Sulfate (IRON) 325 (65 FE) MG TABS Take 1 tablet by mouth daily.        . furosemide (LASIX) 40 MG tablet TAKE 1 TABLET DAILY  90 tablet  3  . hydrOXYzine (ATARAX) 50 MG tablet Take 50 mg by mouth at bedtime as needed.        Marland Kitchen LORazepam (ATIVAN) 1 MG tablet 2 tablets by mouth at bedtime       . losartan (COZAAR) 100 MG tablet Take 1 tablet (100 mg total) by mouth daily.  90 tablet  3  . Multiple Vitamin (MULTIVITAMIN) tablet Take 1 tablet by mouth daily.        Marland Kitchen omeprazole (PRILOSEC) 20 MG  capsule TAKE 2 CAPSULES ONCE DAILY  180 capsule  1  . prasugrel (EFFIENT) 10 MG TABS Take 1 tablet (10 mg total) by mouth daily.  90 tablet  3  . rosuvastatin (CRESTOR) 40 MG tablet Take 1 tablet (40 mg total) by mouth daily.  90 tablet  3  . saxagliptin HCl (ONGLYZA) 5 MG TABS tablet 1/2 tablet once daily  45 tablet  3  . traMADol (ULTRAM) 50 MG tablet Take 1 tablet (50 mg total) by mouth every 4 (four) hours as needed. For pain  540 tablet  3  . travoprost, benzalkonium, (TRAVATAN) 0.004 % ophthalmic solution Place 1 drop into both eyes at bedtime.          Allergies  Allergen Reactions  . Pioglitazone     REACTION: edema    Family History  Problem Relation Age of Onset  . Heart disease Mother     History   Social History  . Marital Status: Married    Spouse Name: N/A    Number of Children: N/A  . Years of Education: N/A   Occupational History  . 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 Smoking status: Former Games developer  . Smokeless tobacco: Not on file  . Alcohol Use: No  . Drug Use: No  . Sexually Active:    Other Topics Concern  . Not on file   Social History Narrative  . No narrative on file     Constitutional: Pt reports fatigue. Denies fever, malaise, headache or abrupt weight changes.  Respiratory: Denies difficulty breathing, shortness of breath, cough or sputum production.   Cardiovascular: Denies chest pain, chest tightness, palpitations or swelling in the hands or feet.   Neurological: Pt reports difficulty with memory. Denies dizziness, difficulty with speech or problems with balance and coordination.  Psych: Pt reports depression, difficulty sleeping. Denies SI/HI  No other specific complaints in a complete review of systems (except as listed in HPI above).     Objective:   Physical Exam  BP 146/82  Pulse 84  Temp 98.2 F (36.8 C) (Oral)  Ht 5\' 8"  (1.727 m)  Wt 181 lb 8 oz (82.328 kg)  BMI 27.60 kg/m2  SpO2 98% Wt  Readings from Last 3 Encounters:  08/23/12 181 lb 8 oz (82.328 kg)  09/23/11 187 lb 6 oz (84.993 kg)  12/26/10 196 lb (88.905 kg)    his stated age, well developed, well nourished in NAD. Cardiovascular: Normal rate and rhythm. S1,S2 noted.  No murmur, rubs or gallops noted. No JVD or BLE edema. No carotid bruits noted. Pulmonary/Chest: Normal effort and positive vesicular breath sounds. No respiratory distress. No wheezes, rales or ronchi noted.   Neurological: Alert and oriented. Cranial nerves II-XII intact. Coordination normal. +DTRs bilaterally. Psychiatric: Mood depressed and affect flat. Pt crying.      Assessment & Plan:   Major Depression, new onset with additional workup required:  Will start Prozac 10 mg daily May benefit from Psychotherapy Take xanax at night to help with sleep  RTC  In 2 weeks or sooner if needed

## 2012-08-23 NOTE — Patient Instructions (Signed)

## 2012-09-20 ENCOUNTER — Telehealth: Payer: Self-pay

## 2012-09-20 NOTE — Telephone Encounter (Signed)
[place], home health nurse called [phone], pt having trouble sleeping, hallucinating, due to the death of his wife, pt is depressed pt stopped taking Prozac thinks the Przozac was causing the hallucinations, pleaswe advise

## 2012-09-21 NOTE — Telephone Encounter (Signed)
Appt next few days

## 2012-09-24 ENCOUNTER — Other Ambulatory Visit: Payer: Self-pay | Admitting: *Deleted

## 2012-09-24 ENCOUNTER — Other Ambulatory Visit: Payer: Self-pay | Admitting: Internal Medicine

## 2012-09-24 DIAGNOSIS — F329 Major depressive disorder, single episode, unspecified: Secondary | ICD-10-CM

## 2012-09-24 MED ORDER — FLUOXETINE HCL 40 MG PO CAPS: 40.0000 mg | ORAL_CAPSULE | Freq: Every day | ORAL | Status: DC

## 2012-09-24 MED ORDER — ALPRAZOLAM 1 MG PO TABS: 1.0000 mg | ORAL_TABLET | Freq: Two times a day (BID) | ORAL | Status: DC | PRN

## 2012-09-24 NOTE — Telephone Encounter (Signed)
Ash, I have increased his prozac to 40 mg and also increased his xanax 1 mg to be taken at night before bed. Rene Kocher

## 2012-09-24 NOTE — Telephone Encounter (Signed)
Pt informed of new rx's. Rx for Alprazolam faxed to [facility].

## 2012-09-24 NOTE — Telephone Encounter (Signed)
Pt was seen last month for depression after spouse passed away. Pt was prescribed Prozac at OV. Pt states that it has not been helping him and he would like rx to help him sleep at night. [facility] [address]

## 2012-09-27 ENCOUNTER — Telehealth: Payer: Self-pay | Admitting: Endocrinology

## 2012-09-27 ENCOUNTER — Telehealth: Payer: Self-pay | Admitting: *Deleted

## 2012-09-27 NOTE — Telephone Encounter (Signed)
Left msg on triage saw regina & she rx fluoxetine was told to increase to 20 mg. Also want to know when he need to take trazodone...Raechel Chute

## 2012-09-27 NOTE — Telephone Encounter (Signed)
Have left messages x 2 on patient's voicemail to return call to schedule appointment.  Awaiting return call from patient to schedule.

## 2012-09-27 NOTE — Telephone Encounter (Signed)
Notified pt with Rene Kocher response...Raechel Chute

## 2012-09-27 NOTE — Telephone Encounter (Signed)
Lucy, I increased his Prozac to 40 mg to be taken every morning. I also increased his xanax to 1 mg to be taken at night for sleep. I do not have trazadone listed on his medication record, so at this point I would tell him not to take that. Rene Kocher

## 2012-09-27 NOTE — Telephone Encounter (Signed)
Called pt no answer LMOM RTC.../lmb 

## 2012-10-08 ENCOUNTER — Telehealth: Payer: Self-pay

## 2012-10-08 DIAGNOSIS — F329 Major depressive disorder, single episode, unspecified: Secondary | ICD-10-CM

## 2012-10-08 DIAGNOSIS — F32A Depression, unspecified: Secondary | ICD-10-CM

## 2012-10-08 MED ORDER — ALPRAZOLAM 1 MG PO TABS: 1.0000 mg | ORAL_TABLET | Freq: Two times a day (BID) | ORAL | Status: DC | PRN

## 2012-10-08 NOTE — Telephone Encounter (Signed)
Pt called requesting refills alprazolam and fluoxetine ok to reill? Pt was orginially only given #10 alprazolam, pt has appt on 10/13/12

## 2012-10-08 NOTE — Telephone Encounter (Signed)
Pt advised rx faxed to [facility] [address]

## 2012-10-08 NOTE — Telephone Encounter (Signed)
i printed 

## 2012-10-13 ENCOUNTER — Ambulatory Visit: Payer: Medicare Other | Admitting: Endocrinology

## 2012-10-22 ENCOUNTER — Ambulatory Visit: Payer: Medicare Other | Admitting: Endocrinology

## 2012-10-25 ENCOUNTER — Ambulatory Visit (INDEPENDENT_AMBULATORY_CARE_PROVIDER_SITE_OTHER): Payer: Medicare Other | Admitting: Endocrinology

## 2012-10-25 ENCOUNTER — Encounter: Payer: Self-pay | Admitting: Endocrinology

## 2012-10-25 VITALS — BP 132/78 | HR 94 | Wt 185.0 lb

## 2012-10-25 DIAGNOSIS — L723 Sebaceous cyst: Secondary | ICD-10-CM

## 2012-10-25 MED ORDER — ALPRAZOLAM 1 MG PO TABS: 1.5000 mg | ORAL_TABLET | Freq: Every evening | ORAL | Status: DC | PRN

## 2012-10-25 NOTE — Progress Notes (Signed)
Subjective:    Patient ID: Brian Wang, male    DOB: 10/13/1922, 79 y.o.   MRN: [id number]  HPI Pt's wife died 3 mos ago.  Since then, pt reports depression and insomnia.  Since on the prozac and xanax, insomnia and depression are both somewhat improved.  His one son is deceased many years ago from HIV, but pt has 2 grandchildren.  They live here, but he wants to return to [place], where he has several siblings.   Pt has a few years of slight nodule at the right axilla, but no assoc pain.  It is enlarging Past Medical History  Diagnosis Date  . DIABETES MELLITUS, TYPE II 05/13/2007  . CHEST WALL PAIN, ACUTE 01/10/2008  . RENAL INSUFFICIENCY, CHRONIC 05/29/2009  . HYPERTENSION 05/13/2007  . ANEMIA, IRON DEFICIENCY 10/25/2008  . INSOMNIA 07/22/2007  . Hyposmolality and/or hyponatremia 07/22/2007  . COLONIC POLYPS, HX OF 05/13/2007  . BENIGN PROSTATIC HYPERTROPHY 05/13/2007  . GERD 05/13/2007  . DYSLIPIDEMIA 05/30/2010  . PERIPHERAL VASCULAR DISEASE 05/13/2007    Past Surgical History  Procedure Laterality Date  . Suprapubic prostatectomy  08/01/2008    Su Grand MD  . Cholecystectomy  03/13/2003    Abigail Miyamoto MD    History   Social History  . Marital Status: Married    Spouse Name: N/A    Number of Children: N/A  . Years of Education: N/A   Occupational History  . 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 Smoking status: Former Games developer  . Smokeless tobacco: Not on file  . Alcohol Use: No  . Drug Use: No  . Sexually Active:    Other Topics Concern  . Not on file   Social History Narrative  . No narrative on file    Current Outpatient Prescriptions on File Prior to Visit  Medication Sig Dispense Refill  . brimonidine (ALPHAGAN P) 0.1 % SOLN 1 drop in right eye daily       . bromocriptine (PARLODEL) 2.5 MG tablet 1/2 tablet at bedtime  45 tablet  3  . cilostazol (PLETAL) 100 MG tablet TAKE 1 TABLET DAILY  90 tablet  3  .  dorzolamide-timolol (COSOPT) 22.3-6.8 MG/ML ophthalmic solution Place 1 drop into both eyes 2 (two) times daily.        . Ferrous Sulfate (IRON) 325 (65 FE) MG TABS Take 1 tablet by mouth daily.        Marland Kitchen FLUoxetine (PROZAC) 40 MG capsule Take 1 capsule (40 mg total) by mouth daily.  90 capsule  3  . furosemide (LASIX) 40 MG tablet TAKE 1 TABLET DAILY  90 tablet  3  . hydrOXYzine (ATARAX) 50 MG tablet Take 50 mg by mouth at bedtime as needed.        Marland Kitchen losartan (COZAAR) 100 MG tablet Take 1 tablet (100 mg total) by mouth daily.  90 tablet  3  . Multiple Vitamin (MULTIVITAMIN) tablet Take 1 tablet by mouth daily.        Marland Kitchen omeprazole (PRILOSEC) 20 MG capsule TAKE 2 CAPSULES ONCE DAILY  180 capsule  1  . prasugrel (EFFIENT) 10 MG TABS Take 1 tablet (10 mg total) by mouth daily.  90 tablet  3  . rosuvastatin (CRESTOR) 40 MG tablet Take 1 tablet (40 mg total) by mouth daily.  90 tablet  3  . saxagliptin HCl (ONGLYZA) 5 MG TABS tablet 1/2 tablet once daily  45 tablet  3  . traMADol (ULTRAM) 50 MG tablet Take 1 tablet (50 mg total) by mouth every 4 (four) hours as needed. For pain  540 tablet  3  . travoprost, benzalkonium, (TRAVATAN) 0.004 % ophthalmic solution Place 1 drop into both eyes at bedtime.         No current facility-administered medications on file prior to visit.    Allergies  Allergen Reactions  . Pioglitazone     REACTION: edema    Family History  Problem Relation Age of Onset  . Heart disease Mother     BP 132/78  Pulse 94  Wt 185 lb (83.915 kg)  BMI 28.14 kg/m2  SpO2 98%  Review of Systems Denies fever and falls    Objective:   Physical Exam VITAL SIGNS:  See vs page GENERAL: no distress Gait: steady with a cane Right axilla: 2 cm sebaceous cyst.     Lab Results  Component Value Date   TSH 1.84 09/23/2011      Assessment & Plan:  Sebaceous cyst, new Depression, improved Insomnia, improved, but needs increased rx

## 2012-10-25 NOTE — Patient Instructions (Addendum)
Please continue the same fluoxetene. Please increase alprazolam to 1 1/2 pills at bedtime as needed for sleep. The nodule at your armpit does not need treatment unless it is painful Please come back for a follow-up appointment in 2 weeks.

## 2012-11-08 ENCOUNTER — Ambulatory Visit: Payer: Medicare Other | Admitting: Endocrinology

## 2012-11-10 ENCOUNTER — Encounter: Payer: Self-pay | Admitting: Endocrinology

## 2012-11-10 ENCOUNTER — Ambulatory Visit (INDEPENDENT_AMBULATORY_CARE_PROVIDER_SITE_OTHER): Payer: Medicare Other | Admitting: Endocrinology

## 2012-11-10 VITALS — BP 130/80 | HR 75 | Wt 189.0 lb

## 2012-11-10 DIAGNOSIS — F329 Major depressive disorder, single episode, unspecified: Secondary | ICD-10-CM

## 2012-11-10 MED ORDER — TRIAZOLAM 0.25 MG PO TABS: 0.2500 mg | ORAL_TABLET | Freq: Every evening | ORAL | Status: DC | PRN

## 2012-11-10 NOTE — Progress Notes (Signed)
Subjective:    Patient ID: Brian Wang, male    DOB: August 29, 1923, 79 y.o.   MRN: [id number]  HPI Pt says the xanax helps the insomnia, but it is too long-acting.  He is awakening feeling "hung over." Past Medical History  Diagnosis Date  . DIABETES MELLITUS, TYPE II 05/13/2007  . CHEST WALL PAIN, ACUTE 01/10/2008  . RENAL INSUFFICIENCY, CHRONIC 05/29/2009  . HYPERTENSION 05/13/2007  . ANEMIA, IRON DEFICIENCY 10/25/2008  . INSOMNIA 07/22/2007  . Hyposmolality and/or hyponatremia 07/22/2007  . COLONIC POLYPS, HX OF 05/13/2007  . BENIGN PROSTATIC HYPERTROPHY 05/13/2007  . GERD 05/13/2007  . DYSLIPIDEMIA 05/30/2010  . PERIPHERAL VASCULAR DISEASE 05/13/2007    Past Surgical History  Procedure Laterality Date  . Suprapubic prostatectomy  08/01/2008    Brian Grand MD  . Cholecystectomy  03/13/2003    Brian Miyamoto MD    History   Social History  . Marital Status: Married    Spouse Name: N/A    Number of Children: N/A  . Years of Education: N/A   Occupational History  . 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 Smoking status: Former Games developer  . Smokeless tobacco: Not on file  . Alcohol Use: No  . Drug Use: No  . Sexually Active:    Other Topics Concern  . Not on file   Social History Narrative  . No narrative on file    Current Outpatient Prescriptions on File Prior to Visit  Medication Sig Dispense Refill  . brimonidine (ALPHAGAN P) 0.1 % SOLN 1 drop in right eye daily       . bromocriptine (PARLODEL) 2.5 MG tablet 1/2 tablet at bedtime  45 tablet  3  . cilostazol (PLETAL) 100 MG tablet TAKE 1 TABLET DAILY  90 tablet  3  . dorzolamide-timolol (COSOPT) 22.3-6.8 MG/ML ophthalmic solution Place 1 drop into both eyes 2 (two) times daily.        . Ferrous Sulfate (IRON) 325 (65 FE) MG TABS Take 1 tablet by mouth daily.        Marland Kitchen FLUoxetine (PROZAC) 40 MG capsule Take 1 capsule (40 mg total) by mouth daily.  90 capsule  3  . furosemide (LASIX) 40 MG  tablet TAKE 1 TABLET DAILY  90 tablet  3  . losartan (COZAAR) 100 MG tablet Take 1 tablet (100 mg total) by mouth daily.  90 tablet  3  . Multiple Vitamin (MULTIVITAMIN) tablet Take 1 tablet by mouth daily.        Marland Kitchen omeprazole (PRILOSEC) 20 MG capsule TAKE 2 CAPSULES ONCE DAILY  180 capsule  1  . prasugrel (EFFIENT) 10 MG TABS Take 1 tablet (10 mg total) by mouth daily.  90 tablet  3  . rosuvastatin (CRESTOR) 40 MG tablet Take 1 tablet (40 mg total) by mouth daily.  90 tablet  3  . saxagliptin HCl (ONGLYZA) 5 MG TABS tablet 1/2 tablet once daily  45 tablet  3  . traMADol (ULTRAM) 50 MG tablet Take 1 tablet (50 mg total) by mouth every 4 (four) hours as needed. For pain  540 tablet  3  . travoprost, benzalkonium, (TRAVATAN) 0.004 % ophthalmic solution Place 1 drop into both eyes at bedtime.         No current facility-administered medications on file prior to visit.    Allergies  Allergen Reactions  . Pioglitazone     REACTION: edema    Family History  Problem Relation Age of Onset  . Heart disease Mother     BP 130/80  Pulse 75  Wt 189 lb (85.73 kg)  BMI 28.74 kg/m2  SpO2 95%   Review of Systems Depression persists.    Objective:   Physical Exam VITAL SIGNS:  See vs page GENERAL: no distress Gait: steady with a cane.     Assessment & Plan:  Depression, improved Insomnia.  He wants to try a shorter-acting med, and i agree

## 2012-11-10 NOTE — Patient Instructions (Addendum)
Please change the alprazolam to triazolam.  Here is a prescription. Please come back for a regular physical appointment in 3 weeks.

## 2012-11-21 ENCOUNTER — Encounter (HOSPITAL_COMMUNITY): Payer: Self-pay

## 2012-11-21 ENCOUNTER — Emergency Department (HOSPITAL_COMMUNITY)
Admission: EM | Admit: 2012-11-21 | Discharge: 2012-11-21 | Disposition: A | Discharge status: Home / Self Care | Payer: Medicare Other | Attending: Emergency Medicine | Admitting: Emergency Medicine

## 2012-11-21 DIAGNOSIS — E785 Hyperlipidemia, unspecified: Secondary | ICD-10-CM | POA: Insufficient documentation

## 2012-11-21 DIAGNOSIS — Z7902 Long term (current) use of antithrombotics/antiplatelets: Secondary | ICD-10-CM | POA: Insufficient documentation

## 2012-11-21 DIAGNOSIS — Z79899 Other long term (current) drug therapy: Secondary | ICD-10-CM | POA: Insufficient documentation

## 2012-11-21 DIAGNOSIS — Z8739 Personal history of other diseases of the musculoskeletal system and connective tissue: Secondary | ICD-10-CM | POA: Insufficient documentation

## 2012-11-21 DIAGNOSIS — Z8669 Personal history of other diseases of the nervous system and sense organs: Secondary | ICD-10-CM | POA: Insufficient documentation

## 2012-11-21 DIAGNOSIS — Z8639 Personal history of other endocrine, nutritional and metabolic disease: Secondary | ICD-10-CM | POA: Insufficient documentation

## 2012-11-21 DIAGNOSIS — R300 Dysuria: Secondary | ICD-10-CM | POA: Insufficient documentation

## 2012-11-21 DIAGNOSIS — Z8546 Personal history of malignant neoplasm of prostate: Secondary | ICD-10-CM | POA: Insufficient documentation

## 2012-11-21 DIAGNOSIS — K219 Gastro-esophageal reflux disease without esophagitis: Secondary | ICD-10-CM | POA: Insufficient documentation

## 2012-11-21 DIAGNOSIS — Z860100 Personal history of colon polyps, unspecified: Secondary | ICD-10-CM | POA: Insufficient documentation

## 2012-11-21 DIAGNOSIS — Z862 Personal history of diseases of the blood and blood-forming organs and certain disorders involving the immune mechanism: Secondary | ICD-10-CM | POA: Insufficient documentation

## 2012-11-21 DIAGNOSIS — Z87891 Personal history of nicotine dependence: Secondary | ICD-10-CM | POA: Insufficient documentation

## 2012-11-21 DIAGNOSIS — N400 Enlarged prostate without lower urinary tract symptoms: Secondary | ICD-10-CM | POA: Insufficient documentation

## 2012-11-21 DIAGNOSIS — R339 Retention of urine, unspecified: Secondary | ICD-10-CM | POA: Insufficient documentation

## 2012-11-21 DIAGNOSIS — I10 Essential (primary) hypertension: Secondary | ICD-10-CM | POA: Insufficient documentation

## 2012-11-21 DIAGNOSIS — Z87448 Personal history of other diseases of urinary system: Secondary | ICD-10-CM | POA: Insufficient documentation

## 2012-11-21 DIAGNOSIS — E119 Type 2 diabetes mellitus without complications: Secondary | ICD-10-CM | POA: Insufficient documentation

## 2012-11-21 DIAGNOSIS — Z8679 Personal history of other diseases of the circulatory system: Secondary | ICD-10-CM | POA: Insufficient documentation

## 2012-11-21 DIAGNOSIS — R3915 Urgency of urination: Secondary | ICD-10-CM | POA: Insufficient documentation

## 2012-11-21 LAB — URINALYSIS, MICROSCOPIC ONLY
Bilirubin Urine: NEGATIVE
Leukocytes, UA: NEGATIVE
Nitrite: NEGATIVE
Specific Gravity, Urine: 1.006 (ref 1.005–1.030)
pH: 6 (ref 5.0–8.0)

## 2012-11-21 MED ORDER — TAMSULOSIN HCL 0.4 MG PO CAPS: 0.4000 mg | ORAL_CAPSULE | Freq: Every day | ORAL | Status: DC

## 2012-11-21 NOTE — ED Notes (Signed)
Patient up to Bathroom with assist. Patient admitted to falling with the last 6 months.

## 2012-11-21 NOTE — ED Notes (Signed)
Attempted to pass #14 and #12Foley urine return noted.Patient having alot of pain with inflation of balloon .  MD Belfi in to eval. Second pass blood noted.

## 2012-11-21 NOTE — ED Notes (Signed)
MD Belfi made aware of blood. Patient voided after the fact and bladder scanned afterwards with  remaining in bladder.

## 2012-11-21 NOTE — ED Notes (Signed)
Patient woke up this morning with swollen genitals having trouble voiding. Complaining that he can not void with  Strong stream. Had last normal void about 3-4  Days ago.  Bladder scan performed- . Hx of Prostate  Removal. Not having any pain but feeling pressure.

## 2012-11-21 NOTE — ED Provider Notes (Signed)
History     CSN: [id number]  Arrival date & time 11/21/12  5784   First MD Initiated Contact with Patient 11/21/12 [phone]      Chief Complaint  Patient presents with  . Urinary Frequency    (Consider location/radiation/quality/duration/timing/severity/associated sxs/prior treatment) HPI Comments: Patient presents with urinary retention. He states that he's had some trouble wording of the last 3-4 days where he is only taking small amounts at a time. He has some pressure feeling in his lower abdomen. He's had a little bit of a decreased appetite but no vomiting or fevers. He has no history of urinary retention in the past. He does have a history of a TURP due to prostate cancer. He denies any drainage from his penis. He denies any burning on urination.  Patient is a 79 y.o. male presenting with frequency.  Urinary Frequency Pertinent negatives include no chest pain, no abdominal pain, no headaches and no shortness of breath.    Past Medical History  Diagnosis Date  . DIABETES MELLITUS, TYPE II 05/13/2007  . CHEST WALL PAIN, ACUTE 01/10/2008  . RENAL INSUFFICIENCY, CHRONIC 05/29/2009  . HYPERTENSION 05/13/2007  . ANEMIA, IRON DEFICIENCY 10/25/2008  . INSOMNIA 07/22/2007  . Hyposmolality and/or hyponatremia 07/22/2007  . COLONIC POLYPS, HX OF 05/13/2007  . BENIGN PROSTATIC HYPERTROPHY 05/13/2007  . GERD 05/13/2007  . DYSLIPIDEMIA 05/30/2010  . PERIPHERAL VASCULAR DISEASE 05/13/2007    Past Surgical History  Procedure Laterality Date  . Suprapubic prostatectomy  08/01/2008    Su Grand MD  . Cholecystectomy  03/13/2003    Abigail Miyamoto MD    Family History  Problem Relation Age of Onset  . Heart disease Mother     History  Substance Use Topics  . Smoking status: Former Games developer  . Smokeless tobacco: Not on file  . Alcohol Use: No      Review of Systems  Constitutional: Negative for fever, chills, diaphoresis and fatigue.  HENT: Negative for congestion, rhinorrhea and  sneezing.   Eyes: Negative.   Respiratory: Negative for cough, chest tightness and shortness of breath.   Cardiovascular: Negative for chest pain and leg swelling.  Gastrointestinal: Negative for nausea, vomiting, abdominal pain, diarrhea and blood in stool.  Genitourinary: Positive for urgency, frequency, decreased urine volume and difficulty urinating. Negative for hematuria, flank pain, penile pain and testicular pain.  Musculoskeletal: Negative for back pain and arthralgias.  Skin: Negative for rash.  Neurological: Negative for dizziness, speech difficulty, weakness, numbness and headaches.    Allergies  Pioglitazone  Home Medications   Current Outpatient Rx  Name  Route  Sig  Dispense  Refill  . brimonidine (ALPHAGAN P) 0.1 % SOLN   Right Eye   Place 1 drop into the right eye 2 (two) times daily.          . cilostazol (PLETAL) 100 MG tablet   Oral   Take 100 mg by mouth every morning.         . dorzolamide-timolol (COSOPT) 22.3-6.8 MG/ML ophthalmic solution   Both Eyes   Place 1 drop into both eyes 2 (two) times daily.           . furosemide (LASIX) 40 MG tablet   Oral   Take 40 mg by mouth every morning.         . latanoprost (XALATAN) 0.005 % ophthalmic solution   Both Eyes   Place 1 drop into both eyes at bedtime.          Marland Kitchen  losartan (COZAAR) 100 MG tablet   Oral   Take 100 mg by mouth daily with lunch.         . Multiple Vitamin (MULTIVITAMIN) tablet   Oral   Take 1 tablet by mouth at bedtime.          Marland Kitchen omeprazole (PRILOSEC) 20 MG capsule   Oral   Take 40 mg by mouth every morning.         . prasugrel (EFFIENT) 10 MG TABS   Oral   Take 10 mg by mouth every morning.         . rosuvastatin (CRESTOR) 40 MG tablet   Oral   Take 40 mg by mouth daily with lunch.         . saxagliptin HCl (ONGLYZA) 5 MG TABS tablet   Oral   Take 2.5 mg by mouth daily with lunch.         . tamsulosin (FLOMAX) 0.4 MG CAPS   Oral   Take 1 capsule  (0.4 mg total) by mouth daily.   10 capsule   0     BP 140/62  Pulse 64  Temp(Src) 98.7 F (37.1 C) (Oral)  Resp 20  SpO2 96%  Physical Exam  Constitutional: He is oriented to person, place, and time. He appears well-developed and well-nourished.  HENT:  Head: Normocephalic and atraumatic.  Eyes: Pupils are equal, round, and reactive to light.  Neck: Normal range of motion. Neck supple.  Cardiovascular: Normal rate, regular rhythm and normal heart sounds.   Pulmonary/Chest: Effort normal and breath sounds normal. No respiratory distress. He has no wheezes. He has no rales. He exhibits no tenderness.  Abdominal: Soft. Bowel sounds are normal. There is no tenderness. There is no rebound and no guarding.  Genitourinary: Testes normal and penis normal. Uncircumcised. No phimosis, paraphimosis or penile tenderness.  Musculoskeletal: Normal range of motion. He exhibits no edema.  Lymphadenopathy:    He has no cervical adenopathy.  Neurological: He is alert and oriented to person, place, and time.  Skin: Skin is warm and dry. No rash noted.  Psychiatric: He has a normal mood and affect.    ED Course  Procedures (including critical care time)  Labs Reviewed  URINALYSIS, MICROSCOPIC ONLY - Abnormal; Notable for the following:    Hgb urine dipstick TRACE (*)    All other components within normal limits  URINE CULTURE   No results found.   1. Urinary retention with incomplete bladder emptying       MDM  Patient was able to urinate here twice and had a residual and of only about 100 cc. There is no evidence of a urinary tract infection on his urinalysis however sure was sent for culture. There was trace blood however this was following a catheter administration. At this point I did not feel that we needed to leave and apparently given that he only had a small amount of residual urine in his bladder. He was feeling better after urinating with no abdominal tenderness. I will go  ahead and start him on Flomax and have him followup next week with Dr. Brunilda Payor.        Rolan Bucco, MD 11/21/12 1118

## 2012-11-22 LAB — URINE CULTURE: Culture: NO GROWTH

## 2012-11-24 ENCOUNTER — Encounter (HOSPITAL_COMMUNITY): Payer: Self-pay | Admitting: *Deleted

## 2012-11-24 ENCOUNTER — Emergency Department (HOSPITAL_COMMUNITY)
Admission: EM | Admit: 2012-11-24 | Discharge: 2012-11-25 | Disposition: A | Discharge status: Home / Self Care | Payer: Medicare Other | Attending: Emergency Medicine | Admitting: Emergency Medicine

## 2012-11-24 ENCOUNTER — Emergency Department (HOSPITAL_COMMUNITY): Payer: Medicare Other

## 2012-11-24 DIAGNOSIS — F329 Major depressive disorder, single episode, unspecified: Secondary | ICD-10-CM

## 2012-11-24 DIAGNOSIS — R630 Anorexia: Secondary | ICD-10-CM | POA: Insufficient documentation

## 2012-11-24 DIAGNOSIS — I129 Hypertensive chronic kidney disease with stage 1 through stage 4 chronic kidney disease, or unspecified chronic kidney disease: Secondary | ICD-10-CM | POA: Insufficient documentation

## 2012-11-24 DIAGNOSIS — Z7902 Long term (current) use of antithrombotics/antiplatelets: Secondary | ICD-10-CM | POA: Insufficient documentation

## 2012-11-24 DIAGNOSIS — K219 Gastro-esophageal reflux disease without esophagitis: Secondary | ICD-10-CM | POA: Insufficient documentation

## 2012-11-24 DIAGNOSIS — F0390 Unspecified dementia without behavioral disturbance: Secondary | ICD-10-CM

## 2012-11-24 DIAGNOSIS — N400 Enlarged prostate without lower urinary tract symptoms: Secondary | ICD-10-CM | POA: Insufficient documentation

## 2012-11-24 DIAGNOSIS — E785 Hyperlipidemia, unspecified: Secondary | ICD-10-CM | POA: Insufficient documentation

## 2012-11-24 DIAGNOSIS — Z87891 Personal history of nicotine dependence: Secondary | ICD-10-CM | POA: Insufficient documentation

## 2012-11-24 DIAGNOSIS — N189 Chronic kidney disease, unspecified: Secondary | ICD-10-CM | POA: Insufficient documentation

## 2012-11-24 DIAGNOSIS — F3289 Other specified depressive episodes: Secondary | ICD-10-CM | POA: Insufficient documentation

## 2012-11-24 DIAGNOSIS — F323 Major depressive disorder, single episode, severe with psychotic features: Secondary | ICD-10-CM

## 2012-11-24 DIAGNOSIS — F4321 Adjustment disorder with depressed mood: Secondary | ICD-10-CM

## 2012-11-24 DIAGNOSIS — Z862 Personal history of diseases of the blood and blood-forming organs and certain disorders involving the immune mechanism: Secondary | ICD-10-CM | POA: Insufficient documentation

## 2012-11-24 DIAGNOSIS — Z79899 Other long term (current) drug therapy: Secondary | ICD-10-CM | POA: Insufficient documentation

## 2012-11-24 DIAGNOSIS — Z8639 Personal history of other endocrine, nutritional and metabolic disease: Secondary | ICD-10-CM | POA: Insufficient documentation

## 2012-11-24 DIAGNOSIS — R5381 Other malaise: Secondary | ICD-10-CM | POA: Insufficient documentation

## 2012-11-24 DIAGNOSIS — R443 Hallucinations, unspecified: Secondary | ICD-10-CM | POA: Insufficient documentation

## 2012-11-24 DIAGNOSIS — Z8679 Personal history of other diseases of the circulatory system: Secondary | ICD-10-CM | POA: Insufficient documentation

## 2012-11-24 DIAGNOSIS — Z860100 Personal history of colon polyps, unspecified: Secondary | ICD-10-CM | POA: Insufficient documentation

## 2012-11-24 DIAGNOSIS — E119 Type 2 diabetes mellitus without complications: Secondary | ICD-10-CM | POA: Insufficient documentation

## 2012-11-24 LAB — CBC WITH DIFFERENTIAL/PLATELET
Basophils Absolute: 0 10*3/uL (ref 0.0–0.1)
Basophils Relative: 0 % (ref 0–1)
Eosinophils Relative: 1 % (ref 0–5)
HCT: 36.7 % — ABNORMAL LOW (ref 39.0–52.0)
MCH: 27.9 pg (ref 26.0–34.0)
MCHC: 34.9 g/dL (ref 30.0–36.0)
MCV: 80.1 fL (ref 78.0–100.0)
Monocytes Absolute: 0.8 10*3/uL (ref 0.1–1.0)
RDW: 13.9 % (ref 11.5–15.5)

## 2012-11-24 LAB — HEPATIC FUNCTION PANEL
ALT: 15 U/L (ref 0–53)
Albumin: 3.5 g/dL (ref 3.5–5.2)
Alkaline Phosphatase: 76 U/L (ref 39–117)
Indirect Bilirubin: 0.9 mg/dL (ref 0.3–0.9)
Total Bilirubin: 1.1 mg/dL (ref 0.3–1.2)

## 2012-11-24 LAB — URINALYSIS, ROUTINE W REFLEX MICROSCOPIC
Bilirubin Urine: NEGATIVE
Glucose, UA: NEGATIVE mg/dL
Hgb urine dipstick: NEGATIVE
Protein, ur: NEGATIVE mg/dL
Urobilinogen, UA: 0.2 mg/dL (ref 0.0–1.0)

## 2012-11-24 LAB — BASIC METABOLIC PANEL
BUN: 20 mg/dL (ref 6–23)
CO2: 23 mEq/L (ref 19–32)
Chloride: 93 mEq/L — ABNORMAL LOW (ref 96–112)
Creatinine, Ser: 1.66 mg/dL — ABNORMAL HIGH (ref 0.50–1.35)
Glucose, Bld: 243 mg/dL — ABNORMAL HIGH (ref 70–99)

## 2012-11-24 LAB — TSH: TSH: 3.746 u[IU]/mL (ref 0.350–4.500)

## 2012-11-24 MED ORDER — BRIMONIDINE TARTRATE 0.15 % OP SOLN
1.0000 [drp] | Freq: Two times a day (BID) | OPHTHALMIC | Status: DC
  Administered 2012-11-24 – 2012-11-25 (×2): 1 [drp] via OPHTHALMIC
  Filled 2012-11-24 (×2): qty 5

## 2012-11-24 MED ORDER — MIRTAZAPINE 7.5 MG PO TABS
7.5000 mg | ORAL_TABLET | Freq: Every day | ORAL | Status: DC
  Administered 2012-11-24: 7.5 mg via ORAL
  Filled 2012-11-24 (×2): qty 1

## 2012-11-24 MED ORDER — DORZOLAMIDE HCL-TIMOLOL MAL 2-0.5 % OP SOLN
1.0000 [drp] | Freq: Two times a day (BID) | OPHTHALMIC | Status: DC
  Administered 2012-11-24 – 2012-11-25 (×2): 1 [drp] via OPHTHALMIC
  Filled 2012-11-24 (×2): qty 10

## 2012-11-24 MED ORDER — LATANOPROST 0.005 % OP SOLN
1.0000 [drp] | Freq: Every day | OPHTHALMIC | Status: DC
  Administered 2012-11-24: 1 [drp] via OPHTHALMIC
  Filled 2012-11-24: qty 2.5

## 2012-11-24 MED ORDER — ADULT MULTIVITAMIN W/MINERALS CH
1.0000 | ORAL_TABLET | Freq: Every day | ORAL | Status: DC
  Administered 2012-11-25: 1 via ORAL
  Filled 2012-11-24: qty 1

## 2012-11-24 MED ORDER — ESCITALOPRAM OXALATE 10 MG PO TABS
10.0000 mg | ORAL_TABLET | Freq: Every day | ORAL | Status: DC
  Administered 2012-11-24 – 2012-11-25 (×2): 10 mg via ORAL
  Filled 2012-11-24 (×2): qty 1

## 2012-11-24 MED ORDER — LOSARTAN POTASSIUM 50 MG PO TABS
100.0000 mg | ORAL_TABLET | Freq: Every day | ORAL | Status: DC
  Administered 2012-11-25: 100 mg via ORAL
  Filled 2012-11-24: qty 2

## 2012-11-24 MED ORDER — FUROSEMIDE 40 MG PO TABS
40.0000 mg | ORAL_TABLET | Freq: Every morning | ORAL | Status: DC
  Administered 2012-11-25: 40 mg via ORAL
  Filled 2012-11-24: qty 1

## 2012-11-24 MED ORDER — SODIUM CHLORIDE 0.9 % IV BOLUS (SEPSIS)
2000.0000 mL | Freq: Once | INTRAVENOUS | Status: AC
  Administered 2012-11-24: 1000 mL via INTRAVENOUS

## 2012-11-24 MED ORDER — PANTOPRAZOLE SODIUM 40 MG PO TBEC
40.0000 mg | DELAYED_RELEASE_TABLET | Freq: Every day | ORAL | Status: DC
  Administered 2012-11-24 – 2012-11-25 (×2): 40 mg via ORAL
  Filled 2012-11-24 (×2): qty 1

## 2012-11-24 MED ORDER — TAMSULOSIN HCL 0.4 MG PO CAPS
0.4000 mg | ORAL_CAPSULE | Freq: Every day | ORAL | Status: DC
  Administered 2012-11-24 – 2012-11-25 (×2): 0.4 mg via ORAL
  Filled 2012-11-24 (×2): qty 1

## 2012-11-24 NOTE — ED Notes (Signed)
Social worker at bedside speaking with pt and pt's family member.

## 2012-11-24 NOTE — ED Notes (Signed)
[id number]<NW> Expected date:<BR> Expected time:<BR> Means of arrival:<BR> Comments:<BR> ems 40

## 2012-11-24 NOTE — ED Provider Notes (Signed)
History     CSN: [id number]  Arrival date & time 11/24/12  1039   First MD Initiated Contact with Patient 11/24/12 1053      Chief Complaint  Patient presents with  . Weakness  . Altered Mental Status    (Consider location/radiation/quality/duration/timing/severity/associated sxs/prior treatment) HPI Comments: Pt with hx of DM2, CKD, BPH comes in with cc of weakness and AMS. Pt reports that his wife passed away in Jul 15, 2023 - and since then he has started hearing voices. He hears multiple voices, worse at night. He previously has had no hx of psychiatric conditions. Pt denies any SI/HI, however, grand daughter reports that patient has poor appetite, and appears severe depressed and is very labile emotionally. He currently lives by himself, with family checking on him frequently. No recent infection, falls. Pt denies nausea, emesis, fevers, chills, chest pains, shortness of breath, headaches, abdominal pain, uti like symptoms.  Patient is a 79 y.o. male presenting with weakness and altered mental status. The history is provided by the patient, medical records and a relative.  Weakness Pertinent negatives include no chest pain, no headaches and no shortness of breath.  Altered Mental Status Pertinent negatives include no chest pain, no headaches and no shortness of breath.    Past Medical History  Diagnosis Date  . DIABETES MELLITUS, TYPE II 05/13/2007  . CHEST WALL PAIN, ACUTE 01/10/2008  . RENAL INSUFFICIENCY, CHRONIC 05/29/2009  . HYPERTENSION 05/13/2007  . ANEMIA, IRON DEFICIENCY 10/25/2008  . INSOMNIA 07/22/2007  . Hyposmolality and/or hyponatremia 07/22/2007  . COLONIC POLYPS, HX OF 05/13/2007  . BENIGN PROSTATIC HYPERTROPHY 05/13/2007  . GERD 05/13/2007  . DYSLIPIDEMIA 05/30/2010  . PERIPHERAL VASCULAR DISEASE 05/13/2007    Past Surgical History  Procedure Laterality Date  . Suprapubic prostatectomy  08/01/2008    Su Grand MD  . Cholecystectomy  03/13/2003    Abigail Miyamoto MD    Family History  Problem Relation Age of Onset  . Heart disease Mother     History  Substance Use Topics  . Smoking status: Former Games developer  . Smokeless tobacco: Not on file  . Alcohol Use: No      Review of Systems  Constitutional: Positive for fatigue. Negative for fever, chills and activity change.  HENT: Negative for neck pain.   Eyes: Negative for visual disturbance.  Respiratory: Negative for cough, chest tightness and shortness of breath.   Cardiovascular: Negative for chest pain.  Gastrointestinal: Negative for abdominal distention.  Genitourinary: Negative for dysuria, enuresis and difficulty urinating.  Musculoskeletal: Negative for arthralgias.  Neurological: Positive for weakness. Negative for dizziness, light-headedness and headaches.  Psychiatric/Behavioral: Positive for hallucinations and altered mental status. Negative for suicidal ideas and confusion.    Allergies  Pioglitazone  Home Medications   Current Outpatient Rx  Name  Route  Sig  Dispense  Refill  . brimonidine (ALPHAGAN P) 0.1 % SOLN   Right Eye   Place 1 drop into the right eye 2 (two) times daily.          . cilostazol (PLETAL) 100 MG tablet   Oral   Take 100 mg by mouth every morning.         . dorzolamide-timolol (COSOPT) 22.3-6.8 MG/ML ophthalmic solution   Both Eyes   Place 1 drop into both eyes 2 (two) times daily.           . furosemide (LASIX) 40 MG tablet   Oral   Take 40 mg by mouth every morning.         Marland Kitchen  latanoprost (XALATAN) 0.005 % ophthalmic solution   Both Eyes   Place 1 drop into both eyes at bedtime.          Marland Kitchen losartan (COZAAR) 100 MG tablet   Oral   Take 100 mg by mouth daily with lunch.         . Multiple Vitamin (MULTIVITAMIN) tablet   Oral   Take 1 tablet by mouth at bedtime.          Marland Kitchen omeprazole (PRILOSEC) 20 MG capsule   Oral   Take 40 mg by mouth every morning.         . prasugrel (EFFIENT) 10 MG TABS   Oral   Take  10 mg by mouth every morning.         . rosuvastatin (CRESTOR) 40 MG tablet   Oral   Take 40 mg by mouth daily with lunch.         . saxagliptin HCl (ONGLYZA) 5 MG TABS tablet   Oral   Take 2.5 mg by mouth daily with lunch.         . tamsulosin (FLOMAX) 0.4 MG CAPS   Oral   Take 1 capsule (0.4 mg total) by mouth daily.   10 capsule   0     BP 102/47  Pulse 71  Temp(Src) 97.9 F (36.6 C) (Oral)  Resp 20  SpO2 100%  Physical Exam  Nursing note and vitals reviewed. Constitutional: He is oriented to person, place, and time. He appears well-developed.  HENT:  Head: Normocephalic and atraumatic.  Eyes: Conjunctivae and EOM are normal. Pupils are equal, round, and reactive to light.  Neck: Normal range of motion. Neck supple.  Cardiovascular: Normal rate, regular rhythm and normal heart sounds.   Pulmonary/Chest: Effort normal and breath sounds normal. No respiratory distress. He has no wheezes.  Abdominal: Soft. Bowel sounds are normal. He exhibits no distension. There is no tenderness. There is no rebound and no guarding.  Musculoskeletal: He exhibits no edema.  Neurological: He is alert and oriented to person, place, and time. No cranial nerve deficit. Coordination normal.  Skin: Skin is warm.    ED Course  Procedures (including critical care time)  Labs Reviewed  BASIC METABOLIC PANEL - Abnormal; Notable for the following:    Sodium 130 (*)    Potassium 3.3 (*)    Chloride 93 (*)    Glucose, Bld 243 (*)    Creatinine, Ser 1.66 (*)    GFR calc non Af Amer 35 (*)    GFR calc Af Amer 41 (*)    All other components within normal limits  CBC WITH DIFFERENTIAL - Abnormal; Notable for the following:    Hemoglobin 12.8 (*)    HCT 36.7 (*)    All other components within normal limits  TROPONIN I  HEPATIC FUNCTION PANEL  URINALYSIS, ROUTINE W REFLEX MICROSCOPIC  TSH   No results found.   No diagnosis found.    MDM   Date: 11/24/2012  Rate: 68  Rhythm:  normal sinus rhythm  QRS Axis: left  Intervals: normal  ST/T Wave abnormalities: nonspecific ST/T changes  Conduction Disutrbances:none  Narrative Interpretation:   Old EKG Reviewed: unchanged  Pt comes in with cc of AMS - hallucinations and weakness. Hx and exam not suggestive of any specific organic reason.  DDx: Sepsis syndrome ACS syndrome DKA Stroke Dementia Infection - pneumonia/UTI/Cellulitis Dehydration Electrolyte abnormality Tox syndrome Depression/Psychoses.  I have ordered basic labs to check for  any infectious etiology and we will also get cardiac enzymes and CT head. TSH ordered. Pt will get psych consultation to see if they have any specific concerns.  Pt's family states that they have discussed nursing home recently, and patient wants to be placed at a nursing home. Will get SW involved as well.   Derwood Kaplan, MD 11/24/12 1231

## 2012-11-24 NOTE — Progress Notes (Signed)
CSW received consult for potential alf placement. CSW discussed with EDP and psychiatrist. Per discussion and chart review,  Patient benefit from antidepressant medication Lexapro for depression and Remeron for sleep and appetite. Patient also requesting to be placed in an assisted living facility so he will be referred to the social services for appropriate placement. Patient grand son, who was at bedside willing to get the help needed for him.   CSW met with Brian, Brian Wang, and spoke with Brian Wang by phone. Brian HCPOA is Brian Wang, Brian Wang. Brian is willing to go to assisted living facility and per discussion with family can afford to pay out of pocket. CSW and Brian family discussed process for assisted living, family to look over options tonight and follow up with csw in the am. CSW provided Brian family with alf list. CSW will initate alf placement process while waiting for patient to psychiatrically stabilize. At this time per psychiatrist, Brian does not need inpatient psychiatric treatment.   Catha Gosselin, LCSWA  [phone] 11/24/2012 1812pm

## 2012-11-24 NOTE — ED Notes (Signed)
MD at bedside. Dr. Shela Commons.", Psychiatrist at bedside speaking with pt and pt's grandson.

## 2012-11-24 NOTE — Progress Notes (Signed)
CSW received consult for snf placement, as well as psych assessment. Per discussion with EDP, patient has hisotry of dementia, and has declined since his wife passed way in November. Pt currently lives at home alone, with support from family. Pt family interested in placement. Per EDP patient hearing multiple voices, worse at night, no history of psychiatric illness. CSW will discuss with psychiatrist and assess patient for disposition needs.   Catha Gosselin, LCSWA  [phone] .11/24/2012 1322pm

## 2012-11-24 NOTE — Consult Note (Signed)
Reason for Consult: dementia, depression and auditory hallucinations  Referring Physician: Dr.Nanavati   Brian Wang is an 79 y.o. male.  HPI: Patient was seen and chart reviewed. Patient has no previous history of mental illness or treatment. Patient has been living by himself with the help of his family members closely monitoring him and helping him with his basic needs. Reportedly his wife of 63 years passed away with the renal failure and also has dementia in November 2013. Patient has been suffering with the loss of his wife, depressed, anxious, worried, isolated, less socialization, unable to go outside for shopping and has been going downhill. Patient stated she used to seeing in the want to seeing back again. He is worried about ringing noises in his years and being forget fullness and lack of stability on his feet weakness bothering him a lot. Patient is skate to go back to his home because of lack of balance and possibly falling down. Reportedly he fell down on steps 3 weeks ago has a right shoulder pain. Reportedly, he likes watching TV staying in his bed, and going out with his grandchildren.   MSE: Patient appeared lying down on his bed, with a depressed mood and flat affect, unshaven beard. He has a decreased psychomotor activity. He is slow in his verbal responses and reportedly scared for his life. He has ringing noises in his head and could not make it out. He denied visual hallucinations, paranoia. Patient has fair insight, judgment and impulse control.    Past Medical History  Diagnosis Date  . DIABETES MELLITUS, TYPE II 05/13/2007  . CHEST WALL PAIN, ACUTE 01/10/2008  . RENAL INSUFFICIENCY, CHRONIC 05/29/2009  . HYPERTENSION 05/13/2007  . ANEMIA, IRON DEFICIENCY 10/25/2008  . INSOMNIA 07/22/2007  . Hyposmolality and/or hyponatremia 07/22/2007  . COLONIC POLYPS, HX OF 05/13/2007  . BENIGN PROSTATIC HYPERTROPHY 05/13/2007  . GERD 05/13/2007  . DYSLIPIDEMIA 05/30/2010  . PERIPHERAL  VASCULAR DISEASE 05/13/2007    Past Surgical History  Procedure Laterality Date  . Suprapubic prostatectomy  08/01/2008    Su Grand MD  . Cholecystectomy  03/13/2003    Abigail Miyamoto MD    Family History  Problem Relation Age of Onset  . Heart disease Mother     Social History:  reports that he has quit smoking. He has never used smokeless tobacco. He reports that he does not drink alcohol or use illicit drugs.  Allergies:  Allergies  Allergen Reactions  . Pioglitazone     REACTION: edema    Medications: I have reviewed the patient's current medications.  Results for orders placed during the hospital encounter of 11/24/12 (from the past 48 hour(s))  BASIC METABOLIC PANEL     Status: Abnormal   Collection Time    11/24/12 11:15 AM      Result Value Range   Sodium 130 (*) 135 - 145 mEq/L   Potassium 3.3 (*) 3.5 - 5.1 mEq/L   Chloride 93 (*) 96 - 112 mEq/L   CO2 23  19 - 32 mEq/L   Glucose, Bld 243 (*) 70 - 99 mg/dL   BUN 20  6 - 23 mg/dL   Creatinine, Ser 1.61 (*) 0.50 - 1.35 mg/dL   Calcium 8.9  8.4 - 09.6 mg/dL   GFR calc non Af Amer 35 (*) >90 mL/min   GFR calc Af Amer 41 (*) >90 mL/min   Comment:            The eGFR has been calculated  using the CKD EPI equation.     This calculation has not been     validated in all clinical     situations.     eGFR's persistently     <90 mL/min signify     possible Chronic Kidney Disease.  CBC WITH DIFFERENTIAL     Status: Abnormal   Collection Time    11/24/12 11:15 AM      Result Value Range   WBC 9.8  4.0 - 10.5 K/uL   RBC 4.58  4.22 - 5.81 MIL/uL   Hemoglobin 12.8 (*) 13.0 - 17.0 g/dL   HCT 65.7 (*) 84.6 - 96.2 %   MCV 80.1  78.0 - 100.0 fL   MCH 27.9  26.0 - 34.0 pg   MCHC 34.9  30.0 - 36.0 g/dL   RDW 95.2  84.1 - 32.4 %   Platelets 312  150 - 400 K/uL   Neutrophils Relative 69  43 - 77 %   Neutro Abs 6.8  1.7 - 7.7 K/uL   Lymphocytes Relative 22  12 - 46 %   Lymphs Abs 2.2  0.7 - 4.0 K/uL    Monocytes Relative 8  3 - 12 %   Monocytes Absolute 0.8  0.1 - 1.0 K/uL   Eosinophils Relative 1  0 - 5 %   Eosinophils Absolute 0.1  0.0 - 0.7 K/uL   Basophils Relative 0  0 - 1 %   Basophils Absolute 0.0  0.0 - 0.1 K/uL  TROPONIN I     Status: None   Collection Time    11/24/12 11:15 AM      Result Value Range   Troponin I <0.30  <0.30 ng/mL   Comment:            Due to the release kinetics of cTnI,     a negative result within the first hours     of the onset of symptoms does not rule out     myocardial infarction with certainty.     If myocardial infarction is still suspected,     repeat the test at appropriate intervals.  HEPATIC FUNCTION PANEL     Status: None   Collection Time    11/24/12 11:15 AM      Result Value Range   Total Protein 7.2  6.0 - 8.3 g/dL   Albumin 3.5  3.5 - 5.2 g/dL   AST 18  0 - 37 U/L   ALT 15  0 - 53 U/L   Alkaline Phosphatase 76  39 - 117 U/L   Total Bilirubin 1.1  0.3 - 1.2 mg/dL   Bilirubin, Direct 0.2  0.0 - 0.3 mg/dL   Indirect Bilirubin 0.9  0.3 - 0.9 mg/dL  URINALYSIS, ROUTINE W REFLEX MICROSCOPIC     Status: None   Collection Time    11/24/12  1:16 PM      Result Value Range   Color, Urine YELLOW  YELLOW   APPearance CLEAR  CLEAR   Specific Gravity, Urine 1.011  1.005 - 1.030   pH 5.0  5.0 - 8.0   Glucose, UA NEGATIVE  NEGATIVE mg/dL   Hgb urine dipstick NEGATIVE  NEGATIVE   Bilirubin Urine NEGATIVE  NEGATIVE   Ketones, ur NEGATIVE  NEGATIVE mg/dL   Protein, ur NEGATIVE  NEGATIVE mg/dL   Urobilinogen, UA 0.2  0.0 - 1.0 mg/dL   Nitrite NEGATIVE  NEGATIVE   Leukocytes, UA NEGATIVE  NEGATIVE   Comment: MICROSCOPIC NOT  DONE ON URINES WITH NEGATIVE PROTEIN, BLOOD, LEUKOCYTES, NITRITE, OR GLUCOSE <1000 mg/dL.    Dg Chest 1 View  11/24/2012  *RADIOLOGY REPORT*  Clinical Data: Weakness.  CHEST - 1 VIEW  Comparison: Chest x-ray 01/10/2008.  Findings: Lung volumes are low.  There is some linear bibasilar opacities favored reflect  subsegmental atelectasis.  No definite consolidative airspace disease.  No pleural effusions.  Pulmonary venous congestion, without frank pulmonary edema.  Heart size is upper limits of normal.  Mediastinal contours are unremarkable. Atherosclerosis in the thoracic aorta.  IMPRESSION: 1.  Low lung volumes with probable bibasilar subsegmental atelectasis. 2.  Atherosclerosis.   Original Report Authenticated By: Trudie Reed, M.D.    Ct Head Wo Contrast  11/24/2012  *RADIOLOGY REPORT*  Clinical Data: Weakness, altered mental status.  CT HEAD WITHOUT CONTRAST  Technique:  Contiguous axial images were obtained from the base of the skull through the vertex without contrast.  Comparison: 06/23/2007.  Findings: No definite acute infarct, acute hemorrhage, mass lesion, mass effect or hydrocephalus.  Atrophy.  Periventricular low attenuation.  Visualized portions of the paranasal sinuses and mastoid air cells are clear.  IMPRESSION:  1.  No acute intracranial abnormality. 2.  Atrophy and chronic microvascular white matter ischemic changes.   Original Report Authenticated By: Leanna Battles, M.D.     Positive for anorexia, anxiety, bad mood, depression, learning difficulty and loss of his wife, grief, memory loss Blood pressure 151/73, pulse 74, temperature 97.9 F (36.6 C), temperature source Oral, resp. rate 17, SpO2 99.00%.   Assessment/Plan:  Major depressive disorder, with the psychotic symptoms  Dementia not otherwise specified  Grief   Recommendations: Patient benefit from antidepressant medication Lexapro for depression and Remeron for sleep and appetite. Patient also requesting to be placed in an assisted living facility so he will be referred to the social services for appropriate placement. Patient grand son, who was at bedside willing to get the help needed for him.    Brian Wang,JANARDHAHA R. 11/24/2012, 5:23 PM

## 2012-11-24 NOTE — ED Notes (Signed)
Pt's eyedrops placed in his chart folder at desk.

## 2012-11-24 NOTE — ED Notes (Signed)
Per EMS, pt from home, reports increasing confusion and weakness.  Pt also reports auditory hallucinations.  Pt is A&O x 4 at this time.

## 2012-11-24 NOTE — ED Notes (Signed)
Pt requesting something to eat. No sandwiches available in the ED at this time. Secretary, Art, stated he would order the patient a meal tray. Pt informed.

## 2012-11-25 MED ORDER — POTASSIUM CHLORIDE CRYS ER 20 MEQ PO TBCR
40.0000 meq | EXTENDED_RELEASE_TABLET | Freq: Once | ORAL | Status: AC
Start: 2012-11-25 — End: 2012-11-25
  Administered 2012-11-25: 40 meq via ORAL
  Filled 2012-11-25: qty 2

## 2012-11-25 MED ORDER — TUBERCULIN PPD 5 UNIT/0.1ML ID SOLN
5.0000 [IU] | Freq: Once | INTRADERMAL | Status: DC
  Administered 2012-11-25: 5 [IU] via INTRADERMAL
  Filled 2012-11-25: qty 0.1

## 2012-11-25 MED ORDER — MIRTAZAPINE 30 MG PO TABS: 15.0000 mg | ORAL_TABLET | Freq: Every day | ORAL | Status: DC

## 2012-11-25 NOTE — Consult Note (Signed)
Reason for Consult: Depression and grief and questionable memory loss Referring Physician:   DELONTE MUSICH is an 79 y.o. male.  HPI: Patient was seen and case discussed with a social service. Patient was responded to this medication positively without side effects. Patient was more calm and no dysphoric movements. Patient requested to remove the screen and keep the door open because he does not like to be alone isolated from his surroundings. Patient has some sleep difficulties and disturbance of appetite. Patient has been in contact with his grandchildren who has been caring for him. Patient contract for safety and psychiatrically stable to be discharged.  MSE: Patient appeared with the depressed mood, but bright affect is normal rate, rhythm and volume of speech. His thought process is linear and goal-directed. He has no suicidal onset ideation or evidence of psychotic symptoms.  Past Medical History  Diagnosis Date  . DIABETES MELLITUS, TYPE II 05/13/2007  . CHEST WALL PAIN, ACUTE 01/10/2008  . RENAL INSUFFICIENCY, CHRONIC 05/29/2009  . HYPERTENSION 05/13/2007  . ANEMIA, IRON DEFICIENCY 10/25/2008  . INSOMNIA 07/22/2007  . Hyposmolality and/or hyponatremia 07/22/2007  . COLONIC POLYPS, HX OF 05/13/2007  . BENIGN PROSTATIC HYPERTROPHY 05/13/2007  . GERD 05/13/2007  . DYSLIPIDEMIA 05/30/2010  . PERIPHERAL VASCULAR DISEASE 05/13/2007    Past Surgical History  Procedure Laterality Date  . Suprapubic prostatectomy  08/01/2008    Su Grand MD  . Cholecystectomy  03/13/2003    Abigail Miyamoto MD    Family History  Problem Relation Age of Onset  . Heart disease Mother     Social History:  reports that he has quit smoking. He has never used smokeless tobacco. He reports that he does not drink alcohol or use illicit drugs.  Allergies:  Allergies  Allergen Reactions  . Pioglitazone     REACTION: edema    Medications: I have reviewed the patient's current medications.  Results for  orders placed during the hospital encounter of 11/24/12 (from the past 48 hour(s))  BASIC METABOLIC PANEL     Status: Abnormal   Collection Time    11/24/12 11:15 AM      Result Value Range   Sodium 130 (*) 135 - 145 mEq/L   Potassium 3.3 (*) 3.5 - 5.1 mEq/L   Chloride 93 (*) 96 - 112 mEq/L   CO2 23  19 - 32 mEq/L   Glucose, Bld 243 (*) 70 - 99 mg/dL   BUN 20  6 - 23 mg/dL   Creatinine, Ser 1.19 (*) 0.50 - 1.35 mg/dL   Calcium 8.9  8.4 - 14.7 mg/dL   GFR calc non Af Amer 35 (*) >90 mL/min   GFR calc Af Amer 41 (*) >90 mL/min   Comment:            The eGFR has been calculated     using the CKD EPI equation.     This calculation has not been     validated in all clinical     situations.     eGFR's persistently     <90 mL/min signify     possible Chronic Kidney Disease.  CBC WITH DIFFERENTIAL     Status: Abnormal   Collection Time    11/24/12 11:15 AM      Result Value Range   WBC 9.8  4.0 - 10.5 K/uL   RBC 4.58  4.22 - 5.81 MIL/uL   Hemoglobin 12.8 (*) 13.0 - 17.0 g/dL   HCT 82.9 (*) 56.2 - 13.0 %  MCV 80.1  78.0 - 100.0 fL   MCH 27.9  26.0 - 34.0 pg   MCHC 34.9  30.0 - 36.0 g/dL   RDW 16.1  09.6 - 04.5 %   Platelets 312  150 - 400 K/uL   Neutrophils Relative 69  43 - 77 %   Neutro Abs 6.8  1.7 - 7.7 K/uL   Lymphocytes Relative 22  12 - 46 %   Lymphs Abs 2.2  0.7 - 4.0 K/uL   Monocytes Relative 8  3 - 12 %   Monocytes Absolute 0.8  0.1 - 1.0 K/uL   Eosinophils Relative 1  0 - 5 %   Eosinophils Absolute 0.1  0.0 - 0.7 K/uL   Basophils Relative 0  0 - 1 %   Basophils Absolute 0.0  0.0 - 0.1 K/uL  TROPONIN I     Status: None   Collection Time    11/24/12 11:15 AM      Result Value Range   Troponin I <0.30  <0.30 ng/mL   Comment:            Due to the release kinetics of cTnI,     a negative result within the first hours     of the onset of symptoms does not rule out     myocardial infarction with certainty.     If myocardial infarction is still suspected,      repeat the test at appropriate intervals.  HEPATIC FUNCTION PANEL     Status: None   Collection Time    11/24/12 11:15 AM      Result Value Range   Total Protein 7.2  6.0 - 8.3 g/dL   Albumin 3.5  3.5 - 5.2 g/dL   AST 18  0 - 37 U/L   ALT 15  0 - 53 U/L   Alkaline Phosphatase 76  39 - 117 U/L   Total Bilirubin 1.1  0.3 - 1.2 mg/dL   Bilirubin, Direct 0.2  0.0 - 0.3 mg/dL   Indirect Bilirubin 0.9  0.3 - 0.9 mg/dL  TSH     Status: None   Collection Time    11/24/12 11:15 AM      Result Value Range   TSH 3.746  0.350 - 4.500 uIU/mL  URINALYSIS, ROUTINE W REFLEX MICROSCOPIC     Status: None   Collection Time    11/24/12  1:16 PM      Result Value Range   Color, Urine YELLOW  YELLOW   APPearance CLEAR  CLEAR   Specific Gravity, Urine 1.011  1.005 - 1.030   pH 5.0  5.0 - 8.0   Glucose, UA NEGATIVE  NEGATIVE mg/dL   Hgb urine dipstick NEGATIVE  NEGATIVE   Bilirubin Urine NEGATIVE  NEGATIVE   Ketones, ur NEGATIVE  NEGATIVE mg/dL   Protein, ur NEGATIVE  NEGATIVE mg/dL   Urobilinogen, UA 0.2  0.0 - 1.0 mg/dL   Nitrite NEGATIVE  NEGATIVE   Leukocytes, UA NEGATIVE  NEGATIVE   Comment: MICROSCOPIC NOT DONE ON URINES WITH NEGATIVE PROTEIN, BLOOD, LEUKOCYTES, NITRITE, OR GLUCOSE <1000 mg/dL.    Dg Chest 1 View  11/24/2012  *RADIOLOGY REPORT*  Clinical Data: Weakness.  CHEST - 1 VIEW  Comparison: Chest x-ray 01/10/2008.  Findings: Lung volumes are low.  There is some linear bibasilar opacities favored reflect subsegmental atelectasis.  No definite consolidative airspace disease.  No pleural effusions.  Pulmonary venous congestion, without frank pulmonary edema.  Heart size is upper  limits of normal.  Mediastinal contours are unremarkable. Atherosclerosis in the thoracic aorta.  IMPRESSION: 1.  Low lung volumes with probable bibasilar subsegmental atelectasis. 2.  Atherosclerosis.   Original Report Authenticated By: Trudie Reed, M.D.    Ct Head Wo Contrast  11/24/2012  *RADIOLOGY REPORT*   Clinical Data: Weakness, altered mental status.  CT HEAD WITHOUT CONTRAST  Technique:  Contiguous axial images were obtained from the base of the skull through the vertex without contrast.  Comparison: 06/23/2007.  Findings: No definite acute infarct, acute hemorrhage, mass lesion, mass effect or hydrocephalus.  Atrophy.  Periventricular low attenuation.  Visualized portions of the paranasal sinuses and mastoid air cells are clear.  IMPRESSION:  1.  No acute intracranial abnormality. 2.  Atrophy and chronic microvascular white matter ischemic changes.   Original Report Authenticated By: Leanna Battles, M.D.     Positive for anxiety and depression Blood pressure 137/63, pulse 73, temperature 97.9 F (36.6 C), temperature source Oral, resp. rate 15, SpO2 99.00%.   Assessment/Plan: Major depressive disorder, severe without psychotic symptoms. Grief. Questionable cognitive deficits.  Recommendations: Patient is psychiatrically stable to be discharged to assisted living facility. Patient medication Remeron was increased to 15 mg at bedtime for better sleep. Recommended to continue his current antidepressant medication medications Lexapro, which can be titrated to 20 mg as needed in the future.  Tory Mckissack,JANARDHAHA R. 11/25/2012, 5:58 PM

## 2012-11-25 NOTE — Progress Notes (Signed)
CSW spoke with pt granddaughter regarding interest in [facility] as pt currently lives close by. Pt and pt family are interested in respite at ALF at this time to try out ALF for long term. CSW provided [facility] with clinical information. [facility] to call pt family to arrange time to complete admission paperwork. Patient awaiting to be evaluated by psychiatrist later this afternoon. Pt anticipated for discharge this afternoon if medically and psychiatrically stable.   Catha Gosselin, LCSWA  [phone] .11/25/2012 11:18am

## 2012-11-25 NOTE — [facility] Suicide Risk Assessment (Signed)
Suicide Risk Assessment  Discharge Assessment     Demographic Factors:  Male, Age 79 or older, Divorced or widowed, Low socioeconomic status and Living alone  Mental Status Per Nursing Assessment::   On Admission:     Current Mental Status by Physician: NA  Loss Factors: Decline in physical health and Financial problems/change in socioeconomic status  Historical Factors: NA  Risk Reduction Factors:   Sense of responsibility to family, Religious beliefs about death, Positive social support and Positive therapeutic relationship  Continued Clinical Symptoms:  Depression:   Anhedonia Insomnia Recent sense of peace/wellbeing Severe  Cognitive Features That Contribute To Risk:  Closed-mindedness Polarized thinking    Suicide Risk:  Minimal: No identifiable suicidal ideation.  Patients presenting with no risk factors but with morbid ruminations; may be classified as minimal risk based on the severity of the depressive symptoms  Discharge Diagnoses:   AXIS I:  Generalized Anxiety Disorder and Major Depression, single episode AXIS II:  Deferred AXIS III:   Past Medical History  Diagnosis Date  . DIABETES MELLITUS, TYPE II 05/13/2007  . CHEST WALL PAIN, ACUTE 01/10/2008  . RENAL INSUFFICIENCY, CHRONIC 05/29/2009  . HYPERTENSION 05/13/2007  . ANEMIA, IRON DEFICIENCY 10/25/2008  . INSOMNIA 07/22/2007  . Hyposmolality and/or hyponatremia 07/22/2007  . COLONIC POLYPS, HX OF 05/13/2007  . BENIGN PROSTATIC HYPERTROPHY 05/13/2007  . GERD 05/13/2007  . DYSLIPIDEMIA 05/30/2010  . PERIPHERAL VASCULAR DISEASE 05/13/2007   AXIS IV:  other psychosocial or environmental problems, problems related to social environment and problems with access to health care services AXIS V:  41-50 serious symptoms  Plan Of Care/Follow-up recommendations:  Activity:  As tolerated Diet:  Regular  Is patient on multiple antipsychotic therapies at discharge:  No   Has Patient had three or more failed trials  of antipsychotic monotherapy by history:  No  Recommended Plan for Multiple Antipsychotic Therapies: Not applicable  Dwayn Moravek,JANARDHAHA R. 11/25/2012, 6:04 PM

## 2012-11-25 NOTE — Progress Notes (Signed)
CSW spoke with patient and patient granddaughter [name], pt HCPOA. Patient plans to dsicharge home when medically and psychiatrically stable. Pt will then go to [facility]. CSW will provide patient and family with the signed FL2 to take to [facility]. CSW confirmed with facility that due to patient being private pay and respite, patient will not need pasarr number.   Catha Gosselin, LCSWA  [phone] 11/25/2012 1442pm

## 2012-11-25 NOTE — Progress Notes (Signed)
Discussed at Long Length of Stay Meeting: Medication adjustment by ED psychiatry and pending SW further interventions for attempts for Assisted living facility placement on 11/25/12 If unable to find placement 11/25/12 Family willing to take pt home until placement secured

## 2012-11-25 NOTE — ED Provider Notes (Signed)
Patient here for placement and has hallucinations. Social work and psych involved. Patient will need placement at a facility. Labs and CT and CXR and UA unremarkable yesterday. Sleeping comfortably this AM. No issues as per nursing. Pending placement by social work today.   Richardean Canal, MD 11/25/12 [phone]

## 2012-11-25 NOTE — Progress Notes (Signed)
Per chart review, patient received ppd test. 14:45 Medication Given tuberculin injection 5 Units - Dose: 5 Units ; Route: Intradermal ; Site: Right Arm ; Scheduled Time: 1415 ; Comment: forearm Elwyn Lade Meginnis, RN   .Catha Gosselin, Theresia Majors  [phone] .11/25/2012 1520pm

## 2012-11-25 NOTE — ED Provider Notes (Signed)
Patient's been cleared by psychiatry and will be discharged to the nursing home.  Brian Wang. Rubin Payor, MD 11/25/12 [phone]

## 2012-12-08 ENCOUNTER — Other Ambulatory Visit: Payer: Self-pay

## 2012-12-08 MED ORDER — TAMSULOSIN HCL 0.4 MG PO CAPS: 0.4000 mg | ORAL_CAPSULE | Freq: Every day | ORAL | Status: AC

## 2012-12-10 ENCOUNTER — Encounter: Payer: Self-pay | Admitting: Endocrinology

## 2012-12-10 ENCOUNTER — Ambulatory Visit (INDEPENDENT_AMBULATORY_CARE_PROVIDER_SITE_OTHER): Payer: Medicare Other | Admitting: Endocrinology

## 2012-12-10 VITALS — BP 126/80 | HR 90 | Wt 182.0 lb

## 2012-12-10 DIAGNOSIS — F329 Major depressive disorder, single episode, unspecified: Secondary | ICD-10-CM

## 2012-12-10 MED ORDER — ALPRAZOLAM 2 MG PO TABS: 2.0000 mg | ORAL_TABLET | Freq: Every evening | ORAL | Status: DC | PRN

## 2012-12-10 NOTE — Progress Notes (Signed)
Subjective:    Patient ID: Brian Wang, male    DOB: Jul 20, 1923, 79 y.o.   MRN: [id number]  HPI Since last ov, pt was seen in ER for depression.  it was precip by wife's death 5 mos ago.  He says he would feel much better if he could sleep.  He says the xanax 1 mg qhs, did not help. Past Medical History  Diagnosis Date  . DIABETES MELLITUS, TYPE II 05/13/2007  . CHEST WALL PAIN, ACUTE 01/10/2008  . RENAL INSUFFICIENCY, CHRONIC 05/29/2009  . HYPERTENSION 05/13/2007  . ANEMIA, IRON DEFICIENCY 10/25/2008  . INSOMNIA 07/22/2007  . Hyposmolality and/or hyponatremia 07/22/2007  . COLONIC POLYPS, HX OF 05/13/2007  . BENIGN PROSTATIC HYPERTROPHY 05/13/2007  . GERD 05/13/2007  . DYSLIPIDEMIA 05/30/2010  . PERIPHERAL VASCULAR DISEASE 05/13/2007    Past Surgical History  Procedure Laterality Date  . Suprapubic prostatectomy  08/01/2008    Su Grand MD  . Cholecystectomy  03/13/2003    Abigail Miyamoto MD    History   Social History  . Marital Status: Married    Spouse Name: N/A    Number of Children: N/A  . Years of Education: N/A   Occupational History  . 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 Smoking status: Former Games developer  . Smokeless tobacco: Never Used  . Alcohol Use: No  . Drug Use: No  . Sexually Active: No   Other Topics Concern  . Not on file   Social History Narrative  . No narrative on file    Current Outpatient Prescriptions on File Prior to Visit  Medication Sig Dispense Refill  . brimonidine (ALPHAGAN P) 0.1 % SOLN Place 1 drop into the right eye 2 (two) times daily.       . cilostazol (PLETAL) 100 MG tablet Take 100 mg by mouth every morning.      . dorzolamide-timolol (COSOPT) 22.3-6.8 MG/ML ophthalmic solution Place 1 drop into both eyes 2 (two) times daily.        . furosemide (LASIX) 40 MG tablet Take 40 mg by mouth every morning.      . latanoprost (XALATAN) 0.005 % ophthalmic solution Place 1 drop into both eyes at bedtime.        Marland Kitchen losartan (COZAAR) 100 MG tablet Take 100 mg by mouth daily with lunch.      . Multiple Vitamin (MULTIVITAMIN) tablet Take 1 tablet by mouth at bedtime.       Marland Kitchen omeprazole (PRILOSEC) 20 MG capsule Take 40 mg by mouth every morning.      . prasugrel (EFFIENT) 10 MG TABS Take 10 mg by mouth every morning.      . rosuvastatin (CRESTOR) 40 MG tablet Take 40 mg by mouth daily with lunch.      . saxagliptin HCl (ONGLYZA) 5 MG TABS tablet Take 2.5 mg by mouth daily with lunch.      . tamsulosin (FLOMAX) 0.4 MG CAPS Take 1 capsule (0.4 mg total) by mouth daily.  10 capsule  0   No current facility-administered medications on file prior to visit.    Allergies  Allergen Reactions  . Pioglitazone     REACTION: edema    Family History  Problem Relation Age of Onset  . Heart disease Mother    BP 126/80  Pulse 90  Wt 182 lb (82.555 kg)  BMI 27.68 kg/m2  SpO2 98%  Review of Systems Denies weight change.  Objective:   Physical Exam VITAL SIGNS:  See vs page GENERAL: no distress PSYCH: Alert and oriented x 3.  He appears depressed. Gait: steady with a cane     Assessment & Plan:  Depression/insomnia, needs increased rx

## 2012-12-10 NOTE — Patient Instructions (Addendum)
Please increase the alprazolam to 2 mg at bedtime.  Here is a prescription. Please come back for a follow-up appointment in 2 weeks.  it is critically important to prevent falling down (keep floor areas well-lit, dry, and free of loose objects.  If you have a cane, walker, or wheelchair, you should use it, even for short trips around the house.  Also, try not to rush).

## 2012-12-13 ENCOUNTER — Telehealth: Payer: Self-pay | Admitting: Endocrinology

## 2012-12-13 NOTE — Telephone Encounter (Signed)
unable to break medication in helf. Pill is not scored and very difficut to break. Sometimes he is able to break it and others he can not. Is there another med he can try. Cb# [phone] / Sherri S.

## 2012-12-13 NOTE — Telephone Encounter (Signed)
Please read note below. Pt is taking Effient. Please advise. Thank you.

## 2012-12-14 NOTE — Telephone Encounter (Signed)
Please ask pt to direct question to prescribing dr

## 2012-12-15 ENCOUNTER — Telehealth: Payer: Self-pay

## 2012-12-15 NOTE — Telephone Encounter (Signed)
Pt states he would like to get generic rx for Effient, pt states that you prescribed this medication for him in the past

## 2012-12-15 NOTE — Telephone Encounter (Signed)
Pt called back states you prescribed this med for him on 06/13/13, also called pharmacist to verify you prescribed, needs to know if he needs to take this med if so, he can not break in half?

## 2012-12-15 NOTE — Telephone Encounter (Signed)
This was rx'ed 07/15/12, because you called and said you were out.  This is for heart problems.  If you do not have heart probs, you should d/c this med.

## 2012-12-15 NOTE — Telephone Encounter (Signed)
This is a heart drug.  Do you know who prescribed this for you?

## 2012-12-15 NOTE — Telephone Encounter (Signed)
Left message on machine for pt 

## 2012-12-24 ENCOUNTER — Ambulatory Visit (INDEPENDENT_AMBULATORY_CARE_PROVIDER_SITE_OTHER): Payer: Medicare Other | Admitting: Endocrinology

## 2012-12-24 ENCOUNTER — Encounter: Payer: Self-pay | Admitting: Endocrinology

## 2012-12-24 VITALS — BP 132/74 | HR 76 | Wt 184.0 lb

## 2012-12-24 DIAGNOSIS — F411 Generalized anxiety disorder: Secondary | ICD-10-CM

## 2012-12-24 DIAGNOSIS — S93409A Sprain of unspecified ligament of unspecified ankle, initial encounter: Secondary | ICD-10-CM | POA: Insufficient documentation

## 2012-12-24 DIAGNOSIS — G4700 Insomnia, unspecified: Secondary | ICD-10-CM

## 2012-12-24 MED ORDER — ALPRAZOLAM 0.5 MG PO TABS: ORAL_TABLET | ORAL | Status: DC

## 2012-12-24 NOTE — Progress Notes (Signed)
Subjective:    Patient ID: Brian Wang, male    DOB: [birth year], 79 y.o.   MRN: [id number]  HPI Since last ov, pt was seen in ER for depression.  it was precip by wife's death 5 mos ago.  He says he would feel much better if he could sleep.  He says the xanax 1 mg qhs, did not help.   Pt says he has no heart probs, and does not know why he is on the effient.  Past Medical History  Diagnosis Date  . DIABETES MELLITUS, TYPE II 05/13/2007  . CHEST WALL PAIN, ACUTE 01/10/2008  . RENAL INSUFFICIENCY, CHRONIC 05/29/2009  . HYPERTENSION 05/13/2007  . ANEMIA, IRON DEFICIENCY 10/25/2008  . INSOMNIA 07/22/2007  . Hyposmolality and/or hyponatremia 07/22/2007  . COLONIC POLYPS, HX OF 05/13/2007  . BENIGN PROSTATIC HYPERTROPHY 05/13/2007  . GERD 05/13/2007  . DYSLIPIDEMIA 05/30/2010  . PERIPHERAL VASCULAR DISEASE 05/13/2007    Past Surgical History  Procedure Laterality Date  . Suprapubic prostatectomy  08/01/2008    Su Grand MD  . Cholecystectomy  03/13/2003    Abigail Miyamoto MD    History   Social History  . Marital Status: Married    Spouse Name: N/A    Number of Children: N/A  . Years of Education: N/A   Occupational History  . 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 Smoking status: Former Games developer  . Smokeless tobacco: Never Used  . Alcohol Use: No  . Drug Use: No  . Sexually Active: No   Other Topics Concern  . Not on file   Social History Narrative  . No narrative on file    Current Outpatient Prescriptions on File Prior to Visit  Medication Sig Dispense Refill  . bimatoprost (LUMIGAN) 0.03 % ophthalmic solution Place 1 drop into both eyes at bedtime.       . brimonidine (ALPHAGAN P) 0.1 % SOLN Place 1 drop into the right eye 2 (two) times daily.       . cilostazol (PLETAL) 100 MG tablet Take 100 mg by mouth every morning.      . dorzolamide-timolol (COSOPT) 22.3-6.8 MG/ML ophthalmic solution Place 1 drop into both eyes 2 (two) times daily.         . furosemide (LASIX) 40 MG tablet Take 40 mg by mouth every morning.      . latanoprost (XALATAN) 0.005 % ophthalmic solution Place 1 drop into both eyes at bedtime.       Marland Kitchen losartan (COZAAR) 100 MG tablet Take 100 mg by mouth daily with lunch.      . Multiple Vitamin (MULTIVITAMIN) tablet Take 1 tablet by mouth at bedtime.       Marland Kitchen omeprazole (PRILOSEC) 20 MG capsule Take 40 mg by mouth every morning.      . rosuvastatin (CRESTOR) 40 MG tablet Take 40 mg by mouth daily with lunch.      . saxagliptin HCl (ONGLYZA) 5 MG TABS tablet Take 2.5 mg by mouth daily with lunch.      . tamsulosin (FLOMAX) 0.4 MG CAPS Take 1 capsule (0.4 mg total) by mouth daily.  10 capsule  0   No current facility-administered medications on file prior to visit.    Allergies  Allergen Reactions  . Pioglitazone     REACTION: edema    Family History  Problem Relation Age of Onset  . Heart disease Mother     BP 132/74  Pulse 76  Wt 184 lb (83.462 kg)  BMI 27.98 kg/m2  SpO2 98%  Review of Systems Depression persists    Objective:   Physical Exam VITAL SIGNS:  See vs page GENERAL: no distress Psych: appears depressed Gait: steady with a walker     Assessment & Plan:  Depression, insomnia, persistent.  The quality of life benefit of increasing the xanax has to be weighed against the benefit.  We agree we should accept the risk.

## 2012-12-24 NOTE — Patient Instructions (Addendum)
Please stop the effient. Please increase the alprazolam to 5 x 0.5 mg at bedtime.  Here is a prescription. Please dispose of the 2 mg pills. Please come back for a follow-up appointment in 2 weeks.  Let's check x-rays, downstairs at the door called " imaging."

## 2013-01-07 ENCOUNTER — Ambulatory Visit: Payer: Medicare Other | Admitting: Endocrinology

## 2013-01-10 ENCOUNTER — Other Ambulatory Visit: Payer: Self-pay | Admitting: *Deleted

## 2013-01-10 MED ORDER — ROSUVASTATIN CALCIUM 40 MG PO TABS: 40.0000 mg | ORAL_TABLET | Freq: Every day | ORAL | Status: DC

## 2013-01-19 ENCOUNTER — Telehealth: Payer: Self-pay | Admitting: Endocrinology

## 2013-01-19 NOTE — Telephone Encounter (Signed)
Pt needs to r/s missed 2 week follow up per Dr. Everardo All. LM w/ pt's wife(?) to have patient call for appt. Currently awaiting a return call / Sherri S.

## 2013-01-25 ENCOUNTER — Telehealth: Payer: Self-pay | Admitting: Endocrinology

## 2013-01-25 MED ORDER — ROSUVASTATIN CALCIUM 40 MG PO TABS: 40.0000 mg | ORAL_TABLET | Freq: Every day | ORAL | Status: DC

## 2013-01-25 MED ORDER — SAXAGLIPTIN HCL 5 MG PO TABS: 2.5000 mg | ORAL_TABLET | Freq: Every day | ORAL | Status: DC

## 2013-01-25 NOTE — Telephone Encounter (Signed)
Pt's daug in law called, please call regarding med changes. CB# [phone] / Roanna Raider

## 2013-01-26 ENCOUNTER — Telehealth: Payer: Self-pay

## 2013-01-26 ENCOUNTER — Other Ambulatory Visit: Payer: Self-pay

## 2013-01-26 MED ORDER — ROSUVASTATIN CALCIUM 40 MG PO TABS: 40.0000 mg | ORAL_TABLET | Freq: Every day | ORAL | Status: DC

## 2013-01-26 NOTE — Telephone Encounter (Signed)
Please call patient and schedule an appointment pt leaving next Tuesday to go out of town.

## 2013-01-26 NOTE — Telephone Encounter (Signed)
Pt called requesting Onglyaza be changed to different med, pt has a hard time tyring to break cut med in half, prefers a meds that is scored [phone]

## 2013-01-26 NOTE — Telephone Encounter (Signed)
Please come in for ov.  Here, we can look at some possibilities together.

## 2013-01-27 ENCOUNTER — Other Ambulatory Visit: Payer: Self-pay | Admitting: *Deleted

## 2013-01-27 MED ORDER — FUROSEMIDE 40 MG PO TABS: 40.0000 mg | ORAL_TABLET | Freq: Every morning | ORAL | Status: DC

## 2013-01-31 ENCOUNTER — Encounter: Payer: Self-pay | Admitting: Endocrinology

## 2013-01-31 ENCOUNTER — Ambulatory Visit (INDEPENDENT_AMBULATORY_CARE_PROVIDER_SITE_OTHER): Payer: Medicare Other | Admitting: Endocrinology

## 2013-01-31 ENCOUNTER — Ambulatory Visit: Payer: Medicare Other | Admitting: Endocrinology

## 2013-01-31 VITALS — BP 122/80 | HR 80 | Ht 67.0 in | Wt 183.0 lb

## 2013-01-31 DIAGNOSIS — D509 Iron deficiency anemia, unspecified: Secondary | ICD-10-CM

## 2013-01-31 DIAGNOSIS — N189 Chronic kidney disease, unspecified: Secondary | ICD-10-CM

## 2013-01-31 DIAGNOSIS — R209 Unspecified disturbances of skin sensation: Secondary | ICD-10-CM

## 2013-01-31 DIAGNOSIS — I10 Essential (primary) hypertension: Secondary | ICD-10-CM

## 2013-01-31 DIAGNOSIS — E119 Type 2 diabetes mellitus without complications: Secondary | ICD-10-CM

## 2013-01-31 DIAGNOSIS — Z79899 Other long term (current) drug therapy: Secondary | ICD-10-CM

## 2013-01-31 DIAGNOSIS — N400 Enlarged prostate without lower urinary tract symptoms: Secondary | ICD-10-CM

## 2013-01-31 LAB — LIPID PANEL
Cholesterol: 134 mg/dL (ref 0–200)
HDL: 42.1 mg/dL (ref >39.00)
Total CHOL/HDL Ratio: 3
Triglycerides: 96 mg/dL (ref 0.0–149.0)

## 2013-01-31 LAB — URINALYSIS, ROUTINE W REFLEX MICROSCOPIC
Bilirubin Urine: NEGATIVE
Ketones, ur: NEGATIVE
Leukocytes, UA: NEGATIVE
Specific Gravity, Urine: 1.02 (ref 1.000–1.030)
Urine Glucose: 100
pH: 6 (ref 5.0–8.0)

## 2013-01-31 LAB — IBC PANEL: Saturation Ratios: 12.2 % — ABNORMAL LOW (ref 20.0–50.0)

## 2013-01-31 LAB — CBC WITH DIFFERENTIAL/PLATELET
Basophils Absolute: 0 10*3/uL (ref 0.0–0.1)
Basophils Relative: 0.5 % (ref 0.0–3.0)
HCT: 38.3 % — ABNORMAL LOW (ref 39.0–52.0)
Hemoglobin: 12.8 g/dL — ABNORMAL LOW (ref 13.0–17.0)
Lymphs Abs: 2.6 10*3/uL (ref 0.7–4.0)
MCHC: 33.5 g/dL (ref 30.0–36.0)
Monocytes Relative: 9.1 % (ref 3.0–12.0)
Neutro Abs: 3.3 10*3/uL (ref 1.4–7.7)
RBC: 4.57 Mil/uL (ref 4.22–5.81)
RDW: 15.1 % — ABNORMAL HIGH (ref 11.5–14.6)

## 2013-01-31 LAB — BASIC METABOLIC PANEL
BUN: 16 mg/dL (ref 6–23)
CO2: 27 mEq/L (ref 19–32)
Chloride: 100 mEq/L (ref 96–112)
Creatinine, Ser: 1.6 mg/dL — ABNORMAL HIGH (ref 0.4–1.5)
Glucose, Bld: 252 mg/dL — ABNORMAL HIGH (ref 70–99)
Potassium: 3.3 mEq/L — ABNORMAL LOW (ref 3.5–5.1)

## 2013-01-31 LAB — HEPATIC FUNCTION PANEL
ALT: 17 U/L (ref 0–53)
AST: 17 U/L (ref 0–37)
Albumin: 3.6 g/dL (ref 3.5–5.2)
Total Bilirubin: 1.1 mg/dL (ref 0.3–1.2)
Total Protein: 6.7 g/dL (ref 6.0–8.3)

## 2013-01-31 LAB — PSA: PSA: 2.74 ng/mL (ref 0.10–4.00)

## 2013-01-31 LAB — URIC ACID: Uric Acid, Serum: 3.5 mg/dL — ABNORMAL LOW (ref 4.0–7.8)

## 2013-01-31 LAB — HEMOGLOBIN A1C: Hgb A1c MFr Bld: 9.4 % — ABNORMAL HIGH (ref 4.6–6.5)

## 2013-01-31 MED ORDER — ROSUVASTATIN CALCIUM 40 MG PO TABS: 40.0000 mg | ORAL_TABLET | Freq: Every day | ORAL | Status: DC

## 2013-01-31 NOTE — Progress Notes (Signed)
Subjective:    Patient ID: Brian Wang, male    DOB: May 24, 1923, 79 y.o.   MRN: [id number]  HPI The state of at least three ongoing medical problems is addressed today, with interval history of each noted here: Pt returns for f/u of type 2 DM (dx'ed 1997; complicated by renal insufficiency and PAD; he does not take metformin due to renal insufficiency).  no cbg record, but states cbg's are in the high-100's.  He wants to take a generic pill, and wants to take qd.  rx is limited by the fact that pt is leaving for [place] in 1-2 days.  He will be gone for at least 1 month.  Anemia: He does not take fe tabs.  Insomnia: dtr says pt is sleeping well on xanax 2x0.5 mg qhs. Past Medical History  Diagnosis Date  . DIABETES MELLITUS, TYPE II 05/13/2007  . CHEST WALL PAIN, ACUTE 01/10/2008  . RENAL INSUFFICIENCY, CHRONIC 05/29/2009  . HYPERTENSION 05/13/2007  . ANEMIA, IRON DEFICIENCY 10/25/2008  . INSOMNIA 07/22/2007  . Hyposmolality and/or hyponatremia 07/22/2007  . COLONIC POLYPS, HX OF 05/13/2007  . BENIGN PROSTATIC HYPERTROPHY 05/13/2007  . GERD 05/13/2007  . DYSLIPIDEMIA 05/30/2010  . PERIPHERAL VASCULAR DISEASE 05/13/2007    Past Surgical History  Procedure Laterality Date  . Suprapubic prostatectomy  08/01/2008    Su Grand MD  . Cholecystectomy  03/13/2003    Abigail Miyamoto MD    History   Social History  . Marital Status: Married    Spouse Name: N/A    Number of Children: N/A  . Years of Education: N/A   Occupational History  . 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 Smoking status: Former Games developer  . Smokeless tobacco: Never Used  . Alcohol Use: No  . Drug Use: No  . Sexually Active: No   Other Topics Concern  . Not on file   Social History Narrative  . No narrative on file    Current Outpatient Prescriptions on File Prior to Visit  Medication Sig Dispense Refill  . bimatoprost (LUMIGAN) 0.03 % ophthalmic solution Place 1 drop into both  eyes at bedtime.       . brimonidine (ALPHAGAN P) 0.1 % SOLN Place 1 drop into the right eye 2 (two) times daily.       . cilostazol (PLETAL) 100 MG tablet Take 100 mg by mouth every morning.      . dorzolamide-timolol (COSOPT) 22.3-6.8 MG/ML ophthalmic solution Place 1 drop into both eyes 2 (two) times daily.        . furosemide (LASIX) 40 MG tablet Take 1 tablet (40 mg total) by mouth every morning.  90 tablet  1  . latanoprost (XALATAN) 0.005 % ophthalmic solution Place 1 drop into both eyes at bedtime.       Marland Kitchen losartan (COZAAR) 100 MG tablet Take 100 mg by mouth daily with lunch.      . Multiple Vitamin (MULTIVITAMIN) tablet Take 1 tablet by mouth at bedtime.       Marland Kitchen omeprazole (PRILOSEC) 20 MG capsule Take 40 mg by mouth every morning.      . saxagliptin HCl (ONGLYZA) 5 MG TABS tablet Take 0.5 tablets (2.5 mg total) by mouth daily with lunch.  30 tablet  3  . tamsulosin (FLOMAX) 0.4 MG CAPS Take 1 capsule (0.4 mg total) by mouth daily.  10 capsule  0   No current facility-administered medications on  file prior to visit.    Allergies  Allergen Reactions  . Pioglitazone     REACTION: edema    Family History  Problem Relation Age of Onset  . Heart disease Mother     BP 122/80  Pulse 80  Ht 5\' 7"  (1.702 m)  Wt 183 lb (83.008 kg)  BMI 28.66 kg/m2  SpO2 98%  Review of Systems denies hypoglycemia and weight change    Objective:   Physical Exam VITAL SIGNS:  See vs page GENERAL: no distress   Lab Results  Component Value Date   WBC 6.7 01/31/2013   HGB 12.8* 01/31/2013   HCT 38.3* 01/31/2013   PLT 295.0 01/31/2013   GLUCOSE 252* 01/31/2013   CHOL 134 01/31/2013   TRIG 96.0 01/31/2013   HDL 42.10 01/31/2013   LDLDIRECT 232.9 05/30/2010   LDLCALC 73 01/31/2013   ALT 17 01/31/2013   AST 17 01/31/2013   NA 136 01/31/2013   K 3.3* 01/31/2013   CL 100 01/31/2013   CREATININE 1.6* 01/31/2013   BUN 16 01/31/2013   CO2 27 01/31/2013   TSH 1.94 01/31/2013   PSA 2.74 01/31/2013   INR 1.0 07/26/2008    HGBA1C 9.4* 01/31/2013   MICROALBUR 11.5* 09/23/2011      Assessment & Plan:  DM: rx is limited by his impending departure for [place]. Anemia, needs increased rx Renal insuff, stable Insomnia: dtr says pt is controlled on a lower dosage of xanax.

## 2013-01-31 NOTE — Patient Instructions (Addendum)
check your blood sugar once a day.  vary the time of day when you check, between before the 3 meals, and at bedtime.  also check if you have symptoms of your blood sugar being too high or too low.  please keep a record of the readings and bring it to your next appointment here.  please call us sooner if your blood sugar goes below 70, or if you have a lot of readings over 200.  blood tests are being requested for you today.  We'll contact you with results.  Based on the blood sugar result, i'll prescribe "bromocriptine," instead of or in additon to the onglyza. It has possible side effects of nausea and dizziness.  These go away with time.  You can avoid these by taking it at bedtime. Please come back for a regular physical appointment in 3 months.

## 2013-02-01 ENCOUNTER — Other Ambulatory Visit: Payer: Self-pay

## 2013-02-01 LAB — PTH, INTACT AND CALCIUM: PTH: 108.3 pg/mL — ABNORMAL HIGH (ref 14.0–72.0)

## 2013-02-01 MED ORDER — BROMOCRIPTINE MESYLATE 2.5 MG PO TABS: 1.2500 mg | ORAL_TABLET | Freq: Every day | ORAL | Status: DC

## 2013-03-29 ENCOUNTER — Other Ambulatory Visit: Payer: Self-pay | Admitting: *Deleted

## 2013-03-29 MED ORDER — ALPRAZOLAM 0.5 MG PO TABS: ORAL_TABLET | ORAL | Status: DC

## 2013-04-12 ENCOUNTER — Telehealth: Payer: Self-pay | Admitting: Endocrinology

## 2013-04-12 MED ORDER — ROSUVASTATIN CALCIUM 40 MG PO TABS: 40.0000 mg | ORAL_TABLET | Freq: Every day | ORAL | Status: DC

## 2013-04-13 ENCOUNTER — Other Ambulatory Visit: Payer: Self-pay | Admitting: *Deleted

## 2013-04-13 ENCOUNTER — Other Ambulatory Visit: Payer: Self-pay

## 2013-04-13 MED ORDER — ALPRAZOLAM 0.5 MG PO TABS: ORAL_TABLET | ORAL | Status: DC

## 2013-04-25 ENCOUNTER — Telehealth: Payer: Self-pay | Admitting: Endocrinology

## 2013-04-25 NOTE — Telephone Encounter (Signed)
Pt advised and states an understanding 

## 2013-04-25 NOTE — Telephone Encounter (Signed)
We can't duplicate rx for controlled substance. Please obtain it from the pharmacy it was sent to.

## 2013-04-25 NOTE — Telephone Encounter (Signed)
Epic says this was refilled week before last.

## 2013-04-25 NOTE — Telephone Encounter (Signed)
Pt left voice mail sttaing he needs a rx for Aprazolam [facility] [address]

## 2013-04-25 NOTE — Telephone Encounter (Signed)
Pt states [facility], please resend

## 2013-04-26 ENCOUNTER — Other Ambulatory Visit: Payer: Self-pay

## 2013-04-26 MED ORDER — OMEPRAZOLE 20 MG PO CPDR: 40.0000 mg | DELAYED_RELEASE_CAPSULE | Freq: Every morning | ORAL | Status: DC

## 2013-04-26 MED ORDER — CILOSTAZOL 100 MG PO TABS: 100.0000 mg | ORAL_TABLET | Freq: Every morning | ORAL | Status: DC

## 2013-04-28 ENCOUNTER — Telehealth: Payer: Self-pay | Admitting: Endocrinology

## 2013-04-28 NOTE — Telephone Encounter (Signed)
Called pt back and spoke with daughter in law. She believes the pt wants attention. She wishes Dr Everardo All would not prescribe him Xanax. She states that he lives with her since his wife has died. She believes he just wants attention like his wife use to give him. I was able to speak with pt and he stated his blood sugars have consistently stayed over 200 and he is having vision problems. I scheduled pt appt for next week.

## 2013-05-04 ENCOUNTER — Other Ambulatory Visit: Payer: Self-pay

## 2013-05-04 ENCOUNTER — Encounter: Payer: Self-pay | Admitting: Endocrinology

## 2013-05-04 ENCOUNTER — Ambulatory Visit (INDEPENDENT_AMBULATORY_CARE_PROVIDER_SITE_OTHER): Payer: Medicare Other | Admitting: Endocrinology

## 2013-05-04 VITALS — BP 136/80 | HR 90 | Ht 65.0 in | Wt 179.0 lb

## 2013-05-04 DIAGNOSIS — N189 Chronic kidney disease, unspecified: Secondary | ICD-10-CM

## 2013-05-04 DIAGNOSIS — D509 Iron deficiency anemia, unspecified: Secondary | ICD-10-CM

## 2013-05-04 DIAGNOSIS — E119 Type 2 diabetes mellitus without complications: Secondary | ICD-10-CM

## 2013-05-04 LAB — BASIC METABOLIC PANEL
BUN: 22 mg/dL (ref 6–23)
CO2: 27 mEq/L (ref 19–32)
Chloride: 98 mEq/L (ref 96–112)
GFR: 51.98 mL/min — ABNORMAL LOW (ref >60.00)
Glucose, Bld: 226 mg/dL — ABNORMAL HIGH (ref 70–99)
Potassium: 3.4 mEq/L — ABNORMAL LOW (ref 3.5–5.1)

## 2013-05-04 LAB — CBC WITH DIFFERENTIAL/PLATELET
Basophils Relative: 0.6 % (ref 0.0–3.0)
Eosinophils Relative: 1.9 % (ref 0.0–5.0)
MCV: 81.6 fl (ref 78.0–100.0)
Monocytes Absolute: 0.5 10*3/uL (ref 0.1–1.0)
Neutrophils Relative %: 50.7 % (ref 43.0–77.0)
RBC: 4.77 Mil/uL (ref 4.22–5.81)
WBC: 7.1 10*3/uL (ref 4.5–10.5)

## 2013-05-04 LAB — IBC PANEL: Iron: 48 ug/dL (ref 42–165)

## 2013-05-04 MED ORDER — ROSUVASTATIN CALCIUM 40 MG PO TABS: 40.0000 mg | ORAL_TABLET | Freq: Every day | ORAL | Status: DC

## 2013-05-04 MED ORDER — FUROSEMIDE 40 MG PO TABS: 40.0000 mg | ORAL_TABLET | Freq: Every morning | ORAL | Status: DC

## 2013-05-04 MED ORDER — OMEPRAZOLE 20 MG PO CPDR: 40.0000 mg | DELAYED_RELEASE_CAPSULE | Freq: Every morning | ORAL | Status: DC

## 2013-05-04 MED ORDER — BROMOCRIPTINE MESYLATE 2.5 MG PO TABS: 1.2500 mg | ORAL_TABLET | Freq: Every day | ORAL | Status: AC

## 2013-05-04 MED ORDER — LOSARTAN POTASSIUM 100 MG PO TABS: 100.0000 mg | ORAL_TABLET | Freq: Every day | ORAL | Status: DC

## 2013-05-04 NOTE — Progress Notes (Signed)
Subjective:    Patient ID: Brian Wang, male    DOB: 05-30-1923, 79 y.o.   MRN: [id number]  HPI The state of at least three ongoing medical problems is addressed today, with interval history of each noted here: Pt returns for f/u of type 2 DM (dx'ed 1997; complicated by renal insufficiency and PAD; he does not take metformin due to renal insufficiency).  no cbg record, but states cbg's are in the 200's.  He wants to take a generic pill, and wants to take qd.  rx is limited by the fact that pt is leaving again for [place] in 1-2 days.  He will be gone for at least 1 month.  Anemia: He does not take fe tabs.  Insomnia: dtr says pt is still sleeping well on xanax 2x0.5 mg qhs.  He says he sleeps during the day.  Past Medical History  Diagnosis Date  . DIABETES MELLITUS, TYPE II 05/13/2007  . CHEST WALL PAIN, ACUTE 01/10/2008  . RENAL INSUFFICIENCY, CHRONIC 05/29/2009  . HYPERTENSION 05/13/2007  . ANEMIA, IRON DEFICIENCY 10/25/2008  . INSOMNIA 07/22/2007  . Hyposmolality and/or hyponatremia 07/22/2007  . COLONIC POLYPS, HX OF 05/13/2007  . BENIGN PROSTATIC HYPERTROPHY 05/13/2007  . GERD 05/13/2007  . DYSLIPIDEMIA 05/30/2010  . PERIPHERAL VASCULAR DISEASE 05/13/2007    Past Surgical History  Procedure Laterality Date  . Suprapubic prostatectomy  08/01/2008    Su Grand MD  . Cholecystectomy  03/13/2003    Abigail Miyamoto MD    History   Social History  . Marital Status: Married    Spouse Name: N/A    Number of Children: N/A  . Years of Education: N/A   Occupational History  . 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 Smoking status: Former Games developer  . Smokeless tobacco: Never Used  . Alcohol Use: No  . Drug Use: No  . Sexual Activity: No   Other Topics Concern  . Not on file   Social History Narrative  . No narrative on file    Current Outpatient Prescriptions on File Prior to Visit  Medication Sig Dispense Refill  . ALPRAZolam (XANAX) 0.5 MG  tablet TAKE 1 TABLET BY MOUTH AT BEDTIME.  90 tablet  0  . bimatoprost (LUMIGAN) 0.03 % ophthalmic solution Place 1 drop into both eyes at bedtime.       . brimonidine (ALPHAGAN P) 0.1 % SOLN Place 1 drop into the right eye 2 (two) times daily.       . cilostazol (PLETAL) 100 MG tablet Take 1 tablet (100 mg total) by mouth every morning.  90 tablet  3  . dorzolamide-timolol (COSOPT) 22.3-6.8 MG/ML ophthalmic solution Place 1 drop into both eyes 2 (two) times daily.        Marland Kitchen latanoprost (XALATAN) 0.005 % ophthalmic solution Place 1 drop into both eyes at bedtime.       . Multiple Vitamin (MULTIVITAMIN) tablet Take 1 tablet by mouth at bedtime.       . saxagliptin HCl (ONGLYZA) 5 MG TABS tablet Take 0.5 tablets (2.5 mg total) by mouth daily with lunch.  30 tablet  3  . tamsulosin (FLOMAX) 0.4 MG CAPS Take 1 capsule (0.4 mg total) by mouth daily.  10 capsule  0   No current facility-administered medications on file prior to visit.    Allergies  Allergen Reactions  . Pioglitazone     REACTION: edema    Family History  Problem Relation Age of Onset  . Heart disease Mother     BP 136/80  Pulse 90  Ht 5\' 5"  (1.651 m)  Wt 179 lb (81.194 kg)  BMI 29.79 kg/m2  SpO2 98%  Review of Systems denies hypoglycemia and weight change.      Objective:   Physical Exam VITAL SIGNS:  See vs page GENERAL: no distress  Lab Results  Component Value Date   WBC 7.1 05/04/2013   HGB 12.9* 05/04/2013   HCT 38.9* 05/04/2013   MCV 81.6 05/04/2013   PLT 313.0 05/04/2013   Lab Results  Component Value Date   HGBA1C 10.7* 05/04/2013      Assessment & Plan:  Insomnia: well-controlled DM: poor control: he needs insulin fe-deficiency anemia, mild

## 2013-05-04 NOTE — Patient Instructions (Addendum)
check your blood sugar once a day.  vary the time of day when you check, between before the 3 meals, and at bedtime.  also check if you have symptoms of your blood sugar being too high or too low.  please keep a record of the readings and bring it to your next appointment here.  please call us sooner if your blood sugar goes below 70, or if you have a lot of readings over 200.  blood tests are being requested for you today.  We'll contact you with results.  Based on the results, we may add-on "nateglinide," for your sugar.   Please come back for a regular physical appointment soon.

## 2013-05-12 ENCOUNTER — Other Ambulatory Visit: Payer: Self-pay

## 2013-05-12 MED ORDER — OMEPRAZOLE 20 MG PO CPDR: 40.0000 mg | DELAYED_RELEASE_CAPSULE | Freq: Every morning | ORAL | Status: DC

## 2013-05-16 ENCOUNTER — Other Ambulatory Visit: Payer: Self-pay | Admitting: *Deleted

## 2013-05-16 MED ORDER — SAXAGLIPTIN HCL 5 MG PO TABS: 2.5000 mg | ORAL_TABLET | Freq: Every day | ORAL | Status: DC

## 2013-05-16 NOTE — Telephone Encounter (Signed)
Rx refill

## 2013-05-17 ENCOUNTER — Telehealth: Payer: Self-pay

## 2013-05-17 NOTE — Telephone Encounter (Signed)
At your last ov here, your blood sugar was much higher. You need insulin to control the diabetes.  Please come back within the next week to address this. If at all possible, please bring a family member who will be living with you, who can also learn how to inject the insulin.

## 2013-05-17 NOTE — Telephone Encounter (Signed)
Pt states onglayza is too expensive $419, please change to a different rx send to [facility]

## 2013-05-17 NOTE — Telephone Encounter (Signed)
Pt advised and states he will call back next week, he has to see who is available to bring him to the office

## 2013-05-30 ENCOUNTER — Ambulatory Visit (INDEPENDENT_AMBULATORY_CARE_PROVIDER_SITE_OTHER): Payer: Medicare Other | Admitting: Endocrinology

## 2013-05-30 ENCOUNTER — Encounter: Payer: Self-pay | Admitting: Endocrinology

## 2013-05-30 VITALS — BP 132/78 | HR 80 | Wt 180.0 lb

## 2013-05-30 DIAGNOSIS — E119 Type 2 diabetes mellitus without complications: Secondary | ICD-10-CM

## 2013-05-30 MED ORDER — NATEGLINIDE 120 MG PO TABS: 120.0000 mg | ORAL_TABLET | Freq: Three times a day (TID) | ORAL | Status: AC

## 2013-05-30 MED ORDER — NATEGLINIDE 120 MG PO TABS: 120.0000 mg | ORAL_TABLET | Freq: Three times a day (TID) | ORAL | Status: DC

## 2013-05-30 NOTE — Progress Notes (Signed)
Subjective:    Patient ID: Brian Wang, male    DOB: 08-22-1923, 79 y.o.   MRN: [id number]  HPI Pt returns for f/u of type 2 DM (dx'ed 1997; he has mild if any neuropathy of the lower extremities; he has associated renal insufficiency and PAD; he does not take metformin due to renal insufficiency).  no cbg record, but states cbg's are in the 200's.  i advised insulin.  He refuses.    He says this dosage of xanax does not control insomnia well, but granddaughter (here with pt today) opposes increasing it. Past Medical History  Diagnosis Date  . DIABETES MELLITUS, TYPE II 05/13/2007  . CHEST WALL PAIN, ACUTE 01/10/2008  . RENAL INSUFFICIENCY, CHRONIC 05/29/2009  . HYPERTENSION 05/13/2007  . ANEMIA, IRON DEFICIENCY 10/25/2008  . INSOMNIA 07/22/2007  . Hyposmolality and/or hyponatremia 07/22/2007  . COLONIC POLYPS, HX OF 05/13/2007  . BENIGN PROSTATIC HYPERTROPHY 05/13/2007  . GERD 05/13/2007  . DYSLIPIDEMIA 05/30/2010  . PERIPHERAL VASCULAR DISEASE 05/13/2007    Past Surgical History  Procedure Laterality Date  . Suprapubic prostatectomy  08/01/2008    Su Grand MD  . Cholecystectomy  03/13/2003    Abigail Miyamoto MD    History   Social History  . Marital Status: Married    Spouse Name: N/A    Number of Children: N/A  . Years of Education: N/A   Occupational History  . 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 Smoking status: Former Games developer  . Smokeless tobacco: Never Used  . Alcohol Use: No  . Drug Use: No  . Sexual Activity: No   Other Topics Concern  . Not on file   Social History Narrative  . No narrative on file    Current Outpatient Prescriptions on File Prior to Visit  Medication Sig Dispense Refill  . ALPRAZolam (XANAX) 0.5 MG tablet TAKE 1 TABLET BY MOUTH AT BEDTIME.  90 tablet  0  . bimatoprost (LUMIGAN) 0.03 % ophthalmic solution Place 1 drop into both eyes at bedtime.       . brimonidine (ALPHAGAN P) 0.1 % SOLN Place 1 drop into the  right eye 2 (two) times daily.       . bromocriptine (PARLODEL) 2.5 MG tablet Take 0.5 tablets (1.25 mg total) by mouth at bedtime.  45 tablet  3  . cilostazol (PLETAL) 100 MG tablet Take 1 tablet (100 mg total) by mouth every morning.  90 tablet  3  . dorzolamide-timolol (COSOPT) 22.3-6.8 MG/ML ophthalmic solution Place 1 drop into both eyes 2 (two) times daily.        . furosemide (LASIX) 40 MG tablet Take 1 tablet (40 mg total) by mouth every morning.  90 tablet  1  . latanoprost (XALATAN) 0.005 % ophthalmic solution Place 1 drop into both eyes at bedtime.       Marland Kitchen losartan (COZAAR) 100 MG tablet Take 1 tablet (100 mg total) by mouth daily with lunch.  30 tablet  6  . Multiple Vitamin (MULTIVITAMIN) tablet Take 1 tablet by mouth at bedtime.       Marland Kitchen omeprazole (PRILOSEC) 20 MG capsule Take 2 capsules (40 mg total) by mouth every morning.  60 capsule  3  . rosuvastatin (CRESTOR) 40 MG tablet Take 1 tablet (40 mg total) by mouth daily with lunch.  90 tablet  3  . saxagliptin HCl (ONGLYZA) 5 MG TABS tablet Take 0.5 tablets (2.5 mg total) by  mouth daily with lunch.  90 tablet  2  . tamsulosin (FLOMAX) 0.4 MG CAPS Take 1 capsule (0.4 mg total) by mouth daily.  10 capsule  0   No current facility-administered medications on file prior to visit.    Allergies  Allergen Reactions  . Pioglitazone     REACTION: edema    Family History  Problem Relation Age of Onset  . Heart disease Mother     BP 132/78  Pulse 80  Wt 180 lb (81.647 kg)  BMI 29.95 kg/m2  SpO2 98%  Review of Systems Denies SOB and weight change.      Objective:   Physical Exam VITAL SIGNS:  See vs page.   GENERAL: no distress. Gait: slow but steady with a cane     Assessment & Plan:  Insomnia: incomplete relief DM: poor control: i advised pt of the risks of refusal of insulin.  Depression: this complicates the rx of both of the above.

## 2013-05-30 NOTE — Patient Instructions (Addendum)
check your blood sugar once a day.  vary the time of day when you check, between before the 3 meals, and at bedtime.  also check if you have symptoms of your blood sugar being too high or too low.  please keep a record of the readings and bring it to your next appointment here.  please call us sooner if your blood sugar goes below 70, or if you have a lot of readings over 200.   blood tests are being requested for you today.  We'll contact you with results.   i have sent a prescription to your pharmacy, to add-on "nateglinide," for your sugar.   Please come back for a regular physical appointment soon.

## 2013-05-31 ENCOUNTER — Other Ambulatory Visit: Payer: Self-pay

## 2013-05-31 MED ORDER — ROSUVASTATIN CALCIUM 40 MG PO TABS: 40.0000 mg | ORAL_TABLET | Freq: Every day | ORAL | Status: AC

## 2013-06-03 ENCOUNTER — Telehealth: Payer: Self-pay | Admitting: Endocrinology

## 2013-06-03 MED ORDER — SAXAGLIPTIN HCL 5 MG PO TABS: 2.5000 mg | ORAL_TABLET | Freq: Every day | ORAL | Status: DC

## 2013-06-03 NOTE — Telephone Encounter (Signed)
Pt would like to p/u samples today of [place], please call [phone] / Sherri S.

## 2013-06-06 ENCOUNTER — Encounter: Payer: Medicare Other | Admitting: Endocrinology

## 2013-06-08 ENCOUNTER — Telehealth: Payer: Self-pay

## 2013-06-08 MED ORDER — SAXAGLIPTIN HCL 5 MG PO TABS: 2.5000 mg | ORAL_TABLET | Freq: Every day | ORAL | Status: DC

## 2013-06-08 MED ORDER — SAXAGLIPTIN HCL 5 MG PO TABS: 2.5000 mg | ORAL_TABLET | Freq: Every day | ORAL | Status: AC

## 2013-06-09 ENCOUNTER — Telehealth: Payer: Self-pay | Admitting: *Deleted

## 2013-06-09 NOTE — Telephone Encounter (Signed)
Other than insulin, you have very few options.  Please reconsider the insulin, and let me know.

## 2013-06-09 NOTE — Telephone Encounter (Signed)
Pt called stating that the rx that Dr Everardo All sent in will be another $400 and he cannot do that. He wants Dr Everardo All to find something cheaper than the Onglyza. Please advise.

## 2013-06-10 NOTE — Telephone Encounter (Signed)
Left message pt to call the office and let us know what he decides to do

## 2013-06-10 NOTE — Telephone Encounter (Signed)
See previous message

## 2013-06-13 ENCOUNTER — Encounter: Payer: Medicare Other | Admitting: Endocrinology

## 2013-06-20 ENCOUNTER — Telehealth: Payer: Self-pay

## 2013-06-20 ENCOUNTER — Other Ambulatory Visit: Payer: Self-pay

## 2013-06-20 MED ORDER — FUROSEMIDE 40 MG PO TABS: 40.0000 mg | ORAL_TABLET | Freq: Every morning | ORAL | Status: DC

## 2013-06-20 MED ORDER — ALPRAZOLAM 0.5 MG PO TABS: ORAL_TABLET | ORAL | Status: AC

## 2013-06-20 NOTE — Telephone Encounter (Signed)
Refill request for Alprazolam 0.5mg  tabs. Please advise.

## 2013-06-20 NOTE — Telephone Encounter (Signed)
i printed 

## 2013-06-20 NOTE — Telephone Encounter (Signed)
Refilled furosemide to [facility]...ds,cma

## 2013-06-24 ENCOUNTER — Other Ambulatory Visit: Payer: Self-pay

## 2013-06-24 MED ORDER — FUROSEMIDE 40 MG PO TABS: 40.0000 mg | ORAL_TABLET | Freq: Every morning | ORAL | Status: AC

## 2013-07-13 ENCOUNTER — Other Ambulatory Visit: Payer: Self-pay | Admitting: *Deleted

## 2013-07-14 ENCOUNTER — Other Ambulatory Visit: Payer: Self-pay | Admitting: *Deleted

## 2013-07-14 NOTE — Telephone Encounter (Signed)
Phone note completed ° °

## 2013-07-15 ENCOUNTER — Other Ambulatory Visit: Payer: Self-pay | Admitting: *Deleted

## 2013-07-15 MED ORDER — LOSARTAN POTASSIUM 100 MG PO TABS: 100.0000 mg | ORAL_TABLET | Freq: Every day | ORAL | Status: DC

## 2013-09-24 ENCOUNTER — Other Ambulatory Visit: Payer: Self-pay | Admitting: Endocrinology

## 2013-09-24 MED ORDER — OMEPRAZOLE 40 MG PO CPDR: 40.0000 mg | DELAYED_RELEASE_CAPSULE | Freq: Every day | ORAL | Status: AC

## 2013-10-14 NOTE — Telephone Encounter (Signed)
I'm assuming this was opened in error, last CMA who opened the encounter is no longer with [facility] 

## 2014-01-03 ENCOUNTER — Telehealth: Payer: Self-pay | Admitting: Endocrinology

## 2014-01-03 MED ORDER — CILOSTAZOL 100 MG PO TABS: 100.0000 mg | ORAL_TABLET | Freq: Every morning | ORAL | Status: DC

## 2014-01-03 MED ORDER — LOSARTAN POTASSIUM 100 MG PO TABS: 100.0000 mg | ORAL_TABLET | Freq: Every day | ORAL | Status: DC

## 2014-01-03 NOTE — Telephone Encounter (Signed)
Medication sent.

## 2014-01-03 NOTE — Telephone Encounter (Signed)
Patient would like his Rx called in Lozartan and Cilostazol Patient is just about out    Thank You

## 2014-01-04 MED ORDER — CILOSTAZOL 100 MG PO TABS: 100.0000 mg | ORAL_TABLET | Freq: Every morning | ORAL | Status: AC

## 2014-01-04 MED ORDER — LOSARTAN POTASSIUM 100 MG PO TABS: 100.0000 mg | ORAL_TABLET | Freq: Every day | ORAL | Status: AC

## 2014-01-04 NOTE — Telephone Encounter (Signed)
[facility] [place], # [phone]. Also told pt to call this pharmacy and see if they received them

## 2014-01-04 NOTE — Telephone Encounter (Signed)
Medication refilled per pt's request.  Pt aware.

## 2014-05-03 ENCOUNTER — Other Ambulatory Visit: Payer: Self-pay | Admitting: Endocrinology

## 2014-07-02 ENCOUNTER — Other Ambulatory Visit: Payer: Self-pay | Admitting: Endocrinology

## 2014-07-03 NOTE — Telephone Encounter (Signed)
Please advise if ok to refill. Pt was last seen on 05/15/2014 thanks!

## 2014-08-29 ENCOUNTER — Other Ambulatory Visit: Payer: Self-pay | Admitting: *Deleted

## 2014-08-29 MED ORDER — OMEPRAZOLE 20 MG PO CPDR: 40.0000 mg | DELAYED_RELEASE_CAPSULE | Freq: Every morning | ORAL | Status: DC

## 2014-10-28 ENCOUNTER — Other Ambulatory Visit: Payer: Self-pay | Admitting: Endocrinology

## 2014-11-03 ENCOUNTER — Other Ambulatory Visit: Payer: Self-pay | Admitting: Endocrinology

## 2014-11-06 ENCOUNTER — Other Ambulatory Visit: Payer: Self-pay | Admitting: *Deleted

## 2014-11-06 MED ORDER — OMEPRAZOLE 20 MG PO CPDR: 40.0000 mg | DELAYED_RELEASE_CAPSULE | Freq: Every morning | ORAL | Status: DC

## 2014-11-06 NOTE — Telephone Encounter (Signed)
Please refill x 1 Ov is due  

## 2014-11-06 NOTE — Telephone Encounter (Signed)
Ok to refill 

## 2015-01-12 IMAGING — CT CT HEAD W/O CM
2 series · 16 of 30 positions shown, 20 images · non-contrast
Comparison: 06/23/2007.

CLINICAL DATA: Weakness, altered mental status.

CT HEAD WITHOUT CONTRAST
TECHNIQUE: Contiguous axial images were obtained from the base of
the skull through the vertex without contrast.

[Series 2: head w/o · axial · non-contrast · 0.43mm/px · z∈[-186,-70]mm · 13 of 27 slices shown, 17 images]
[im 2/27  brain]
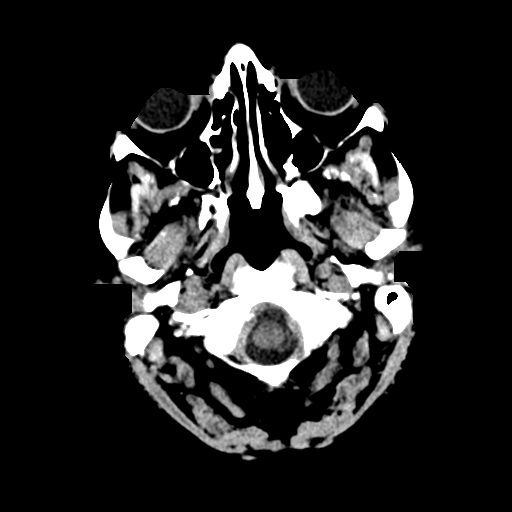
[im 2/27  bone]
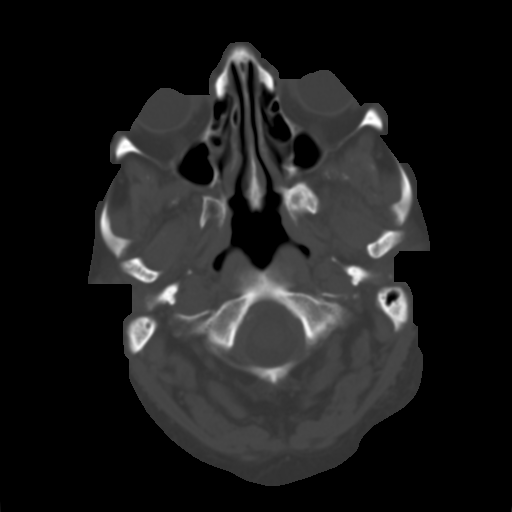
[im 4/27  brain]
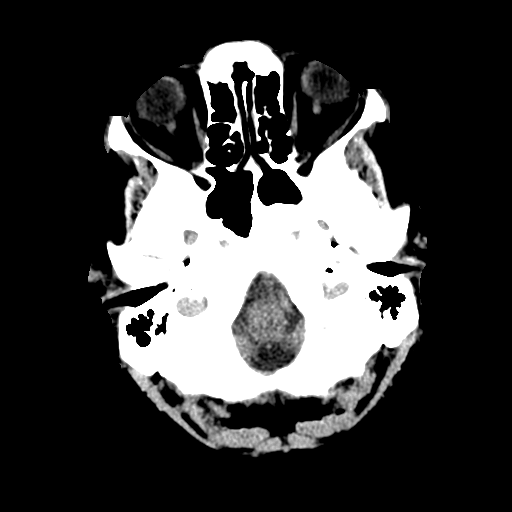
[im 6/27  brain]
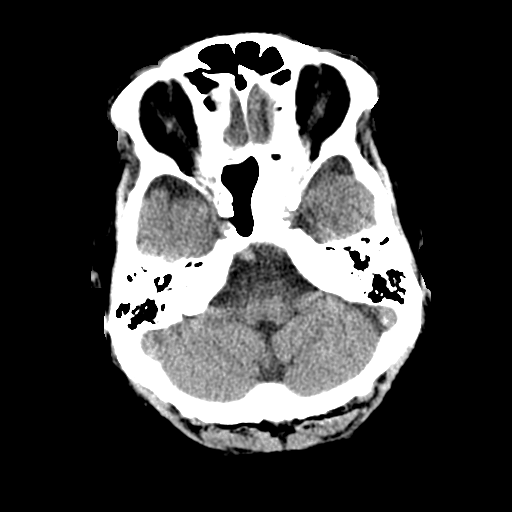
[im 8/27  brain]
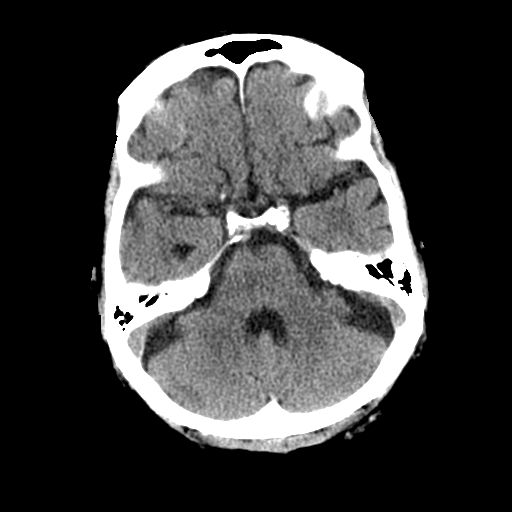
[im 10/27  brain]
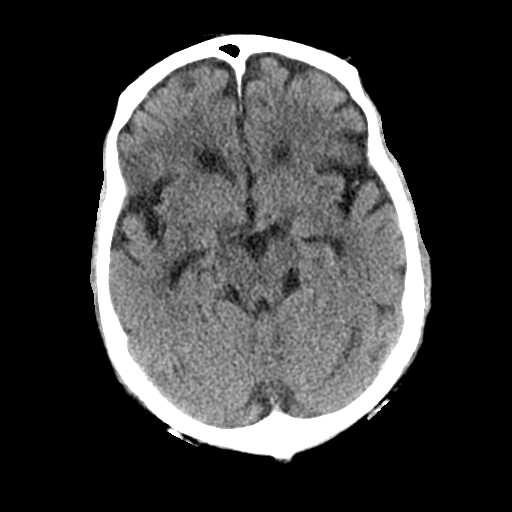
[im 10/27  bone]
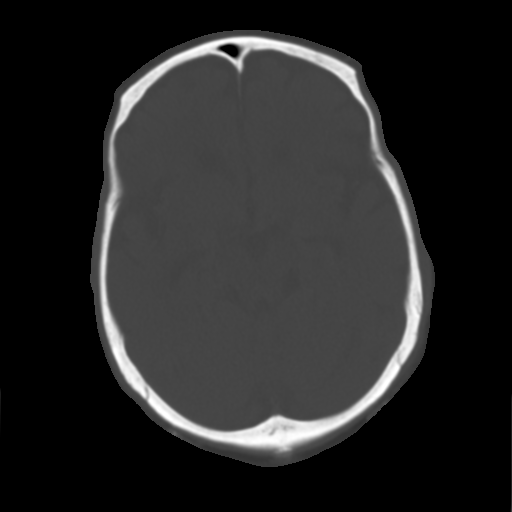
[im 12/27  brain]
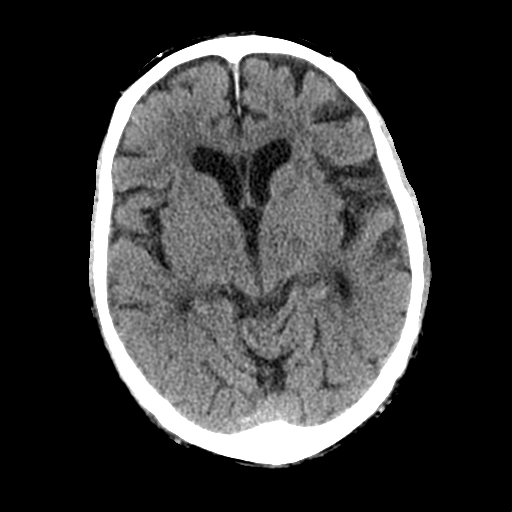
[im 14/27  brain]
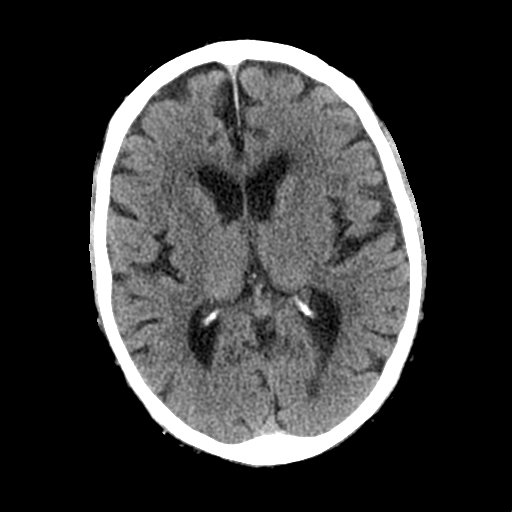
[im 15/27  brain]
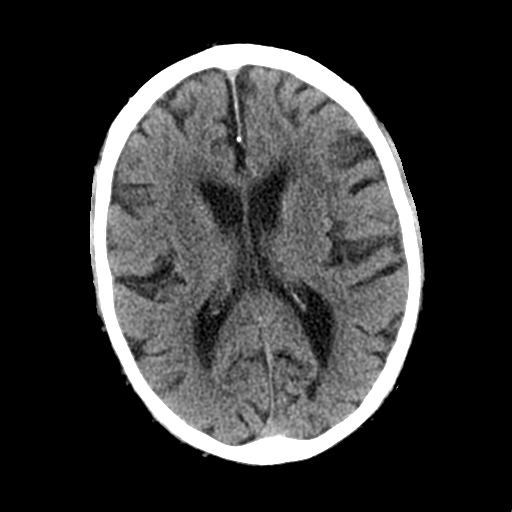
[im 17/27  brain]
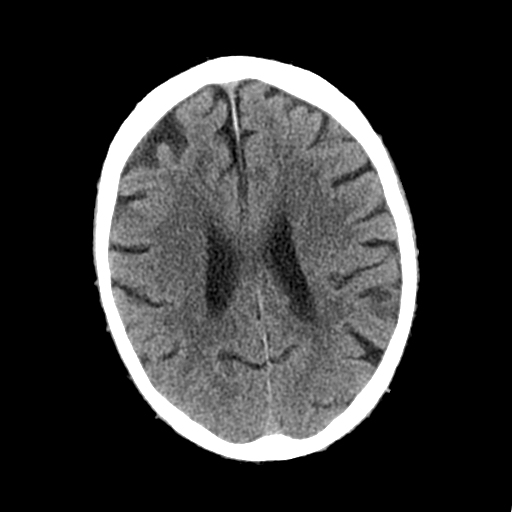
[im 17/27  bone]
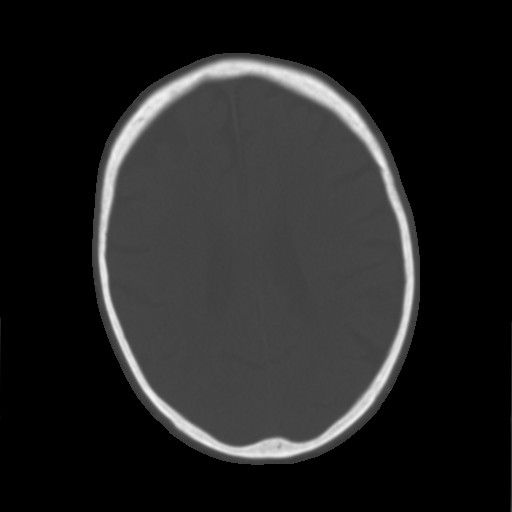
[im 19/27  brain]
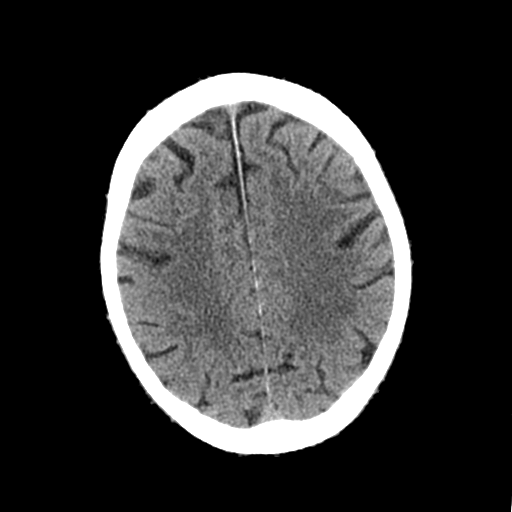
[im 21/27  brain]
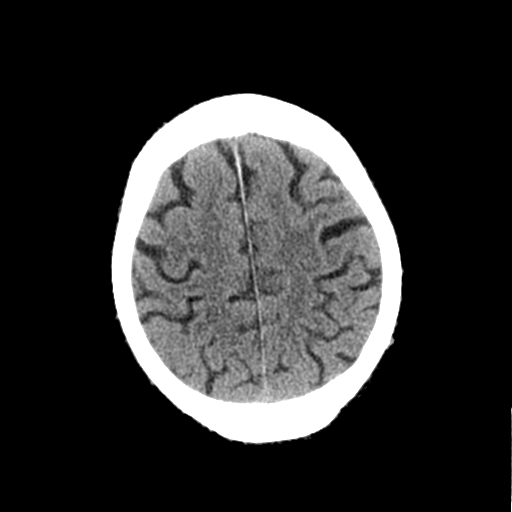
[im 23/27  brain]
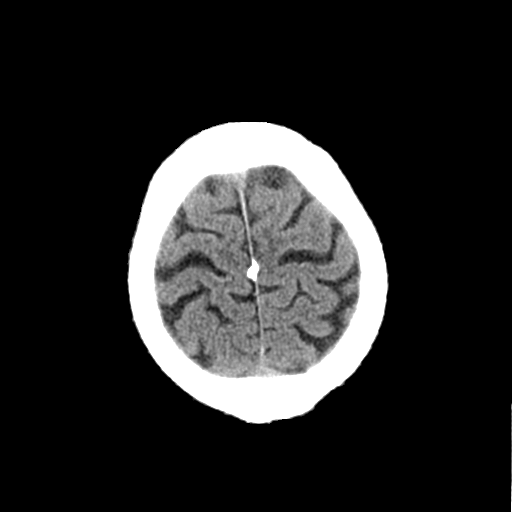
[im 25/27  brain]
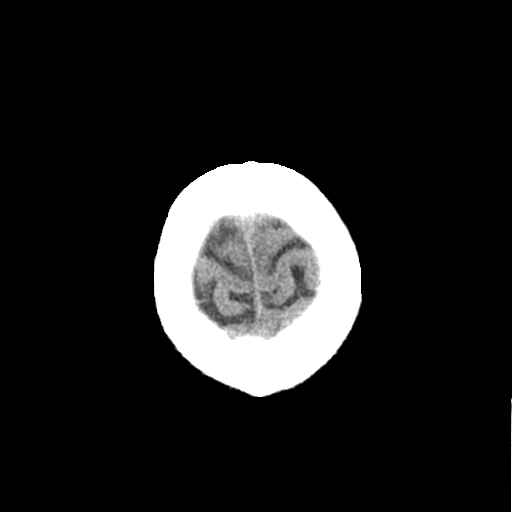
[im 25/27  bone]
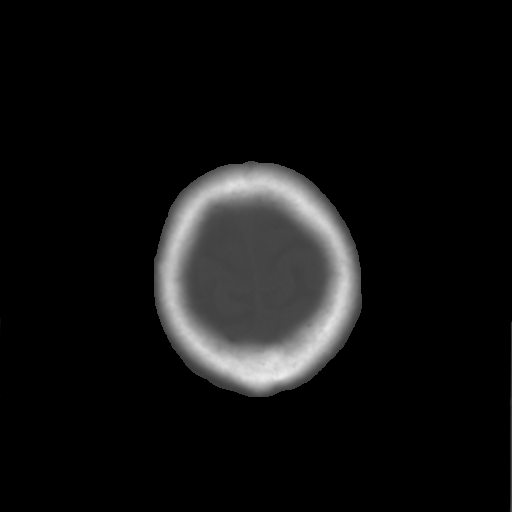

[Series 3: bone windows · axial · 0.43mm/px · z∈[-186,-146]mm · 3 of 27 slices shown]
[im 2/27  bone]
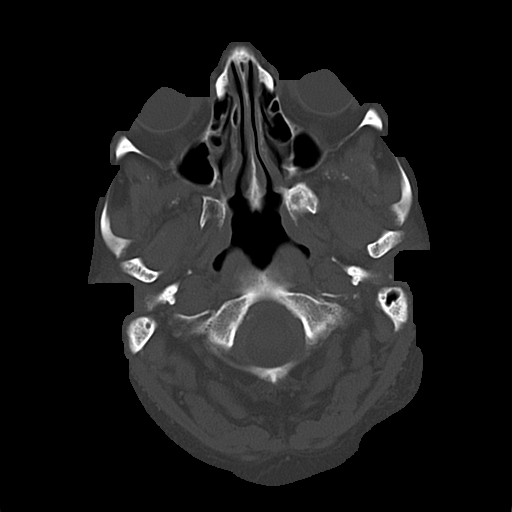
[im 6/27  bone]
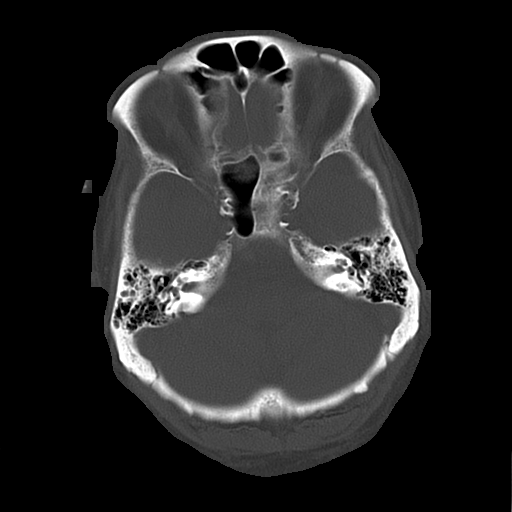
[im 10/27  bone]
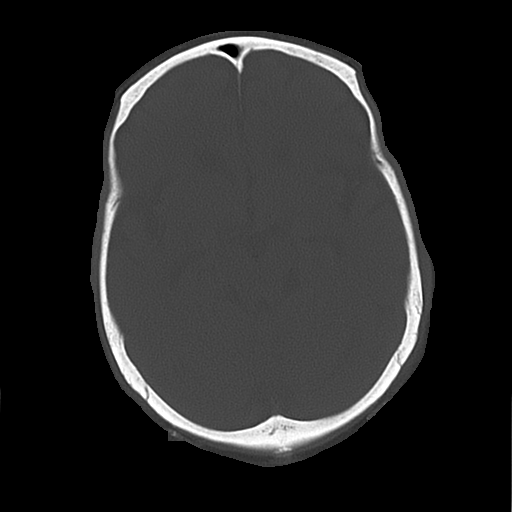

[16 of 30 positions shown; findings below may reference images not displayed]

FINDINGS: No definite acute infarct, acute hemorrhage, mass lesion,
mass effect or hydrocephalus.  Atrophy.  Periventricular low
attenuation.  Visualized portions of the paranasal sinuses and
mastoid air cells are clear.
IMPRESSION: 1.  No acute intracranial abnormality.
2.  Atrophy and chronic microvascular white matter ischemic
changes.

## 2015-03-20 ENCOUNTER — Other Ambulatory Visit: Payer: Self-pay | Admitting: Endocrinology

## 2015-04-09 ENCOUNTER — Other Ambulatory Visit: Payer: Self-pay | Admitting: Endocrinology

## 2015-04-11 ENCOUNTER — Other Ambulatory Visit: Payer: Self-pay | Admitting: Endocrinology

## 2015-04-12 NOTE — Telephone Encounter (Signed)
Please advise if we can refill this medication pt has not been seen here since 2014. Thanks!

## 2015-07-08 ENCOUNTER — Other Ambulatory Visit: Payer: Self-pay | Admitting: Endocrinology

## 2019-04-02 DEATH — deceased
